# Patient Record
Sex: Male | Born: 1985
Health system: Southern US, Community
[De-identification: ages and names within clinical notes are randomized; demographics above are authoritative.]

## PROBLEM LIST (undated history)

## (undated) DIAGNOSIS — F32A Depression, unspecified: Secondary | ICD-10-CM

## (undated) DIAGNOSIS — G44229 Chronic tension-type headache, not intractable: Secondary | ICD-10-CM

## (undated) DIAGNOSIS — T7840XA Allergy, unspecified, initial encounter: Secondary | ICD-10-CM

## (undated) DIAGNOSIS — K219 Gastro-esophageal reflux disease without esophagitis: Secondary | ICD-10-CM

## (undated) DIAGNOSIS — F329 Major depressive disorder, single episode, unspecified: Secondary | ICD-10-CM

## (undated) DIAGNOSIS — F419 Anxiety disorder, unspecified: Secondary | ICD-10-CM

## (undated) HISTORY — DX: Depression, unspecified: F32.A

## (undated) HISTORY — DX: Allergy, unspecified, initial encounter: T78.40XA

## (undated) HISTORY — PX: VASECTOMY: SHX75

## (undated) HISTORY — DX: Anxiety disorder, unspecified: F41.9

## (undated) HISTORY — DX: Major depressive disorder, single episode, unspecified: F32.9

## (undated) HISTORY — DX: Chronic tension-type headache, not intractable: G44.229

## (undated) HISTORY — DX: Gastro-esophageal reflux disease without esophagitis: K21.9

---
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
## 2015-09-07 ENCOUNTER — Other Ambulatory Visit: Payer: Self-pay | Admitting: Family Medicine

## 2015-09-07 MED ORDER — OSELTAMIVIR PHOSPHATE 75 MG PO CAPS: 75.0000 mg | ORAL_CAPSULE | Freq: Two times a day (BID) | ORAL | Status: DC

## 2016-03-30 DIAGNOSIS — H5213 Myopia, bilateral: Secondary | ICD-10-CM | POA: Diagnosis not present

## 2016-05-29 ENCOUNTER — Other Ambulatory Visit: Payer: Self-pay | Admitting: Family

## 2016-05-29 DIAGNOSIS — R05 Cough: Secondary | ICD-10-CM

## 2016-05-29 DIAGNOSIS — R059 Cough, unspecified: Secondary | ICD-10-CM

## 2016-05-29 MED ORDER — BENZONATATE 200 MG PO CAPS: 200.0000 mg | ORAL_CAPSULE | Freq: Two times a day (BID) | ORAL | 0 refills | Status: DC | PRN

## 2016-05-29 NOTE — Progress Notes (Signed)
Cough 2 weeks waxing and waning. Nasal congestion. No fever, wheezing Lung sounds clear PRN tessalon

## 2016-05-30 ENCOUNTER — Other Ambulatory Visit: Payer: Self-pay | Admitting: Family Medicine

## 2016-05-30 MED ORDER — HYDROCOD POLST-CPM POLST ER 10-8 MG/5ML PO SUER: 5.0000 mL | Freq: Two times a day (BID) | ORAL | 0 refills | Status: DC | PRN

## 2017-01-31 ENCOUNTER — Ambulatory Visit (INDEPENDENT_AMBULATORY_CARE_PROVIDER_SITE_OTHER): Payer: 59 | Admitting: Family Medicine

## 2017-01-31 ENCOUNTER — Encounter: Payer: Self-pay | Admitting: Family Medicine

## 2017-01-31 VITALS — BP 118/78 | HR 96 | Temp 98.8°F | Ht 75.5 in | Wt 201.0 lb

## 2017-01-31 DIAGNOSIS — F419 Anxiety disorder, unspecified: Secondary | ICD-10-CM

## 2017-01-31 DIAGNOSIS — Z79899 Other long term (current) drug therapy: Secondary | ICD-10-CM

## 2017-01-31 DIAGNOSIS — T7840XA Allergy, unspecified, initial encounter: Secondary | ICD-10-CM

## 2017-01-31 DIAGNOSIS — K219 Gastro-esophageal reflux disease without esophagitis: Secondary | ICD-10-CM | POA: Diagnosis not present

## 2017-01-31 DIAGNOSIS — Z0000 Encounter for general adult medical examination without abnormal findings: Secondary | ICD-10-CM | POA: Diagnosis not present

## 2017-01-31 DIAGNOSIS — G44229 Chronic tension-type headache, not intractable: Secondary | ICD-10-CM | POA: Diagnosis not present

## 2017-01-31 DIAGNOSIS — Z23 Encounter for immunization: Secondary | ICD-10-CM

## 2017-01-31 DIAGNOSIS — Z1159 Encounter for screening for other viral diseases: Secondary | ICD-10-CM | POA: Diagnosis not present

## 2017-01-31 DIAGNOSIS — Z114 Encounter for screening for human immunodeficiency virus [HIV]: Secondary | ICD-10-CM

## 2017-01-31 NOTE — Progress Notes (Signed)
Phone: [phone]  Subjective:  Patient presents today to establish care.  Prior patient of pediatrician- years ago. Chief complaint-noted.   See problem oriented charting  The following were reviewed and entered/updated in epic: Past Medical History:  Diagnosis Date  . Allergy    claritin prn  . Anxiety    sometimes gets concerned about medical issues. stress with work.   . Chronic tension headaches    improved with change in pillow- monitoring to see if will still occur  . Depression    College  . GERD (gastroesophageal reflux disease)    prilosec 20mg  since weight gain- wants to be closer to 190 then trial off.    Patient Active Problem List   Diagnosis Date Noted  . Anxiety   . GERD (gastroesophageal reflux disease)   . Chronic tension headaches   . Allergy    Past Surgical History:  Procedure Laterality Date  . WISDOM TOOTH EXTRACTION Bilateral 2002    Family History  Problem Relation Age of Onset  . Healthy Mother   . Cancer Father        GIST  . Lung cancer Maternal Grandmother        smoker  . Diabetes Maternal Grandfather   . Stroke Maternal Grandfather   . Skin cancer Paternal Grandmother   . Arthritis/Rheumatoid Paternal Grandmother   . Skin cancer Paternal Grandfather   . Heart disease Paternal Grandfather        CABG in 63s  . Testicular cancer Paternal Uncle   . Hypertension Paternal Uncle   . Hyperlipidemia Paternal Uncle     Medications- reviewed and updated Current Outpatient Prescriptions  Medication Sig Dispense Refill  . esomeprazole (NEXIUM) 20 MG capsule Take 20 mg by mouth daily at 12 noon.    . fluticasone (FLONASE) 50 MCG/ACT nasal spray Place 1 spray into both nostrils daily as needed for allergies or rhinitis.    Marland Kitchen loratadine (CLARITIN) 10 MG tablet Take 10 mg by mouth daily.     No current facility-administered medications for this visit.     Allergies-reviewed and updated No Known Allergies  Social History   Social  History  . Marital status: Married    Spouse name: N/A  . Number of children: N/A  . Years of education: N/A   Social History Main Topics  . Smoking status: Never Smoker  . Smokeless tobacco: Never Used  . Alcohol use 1.2 - 7.2 oz/week    2 - 12 Standard drinks or equivalent per week  . Drug use: No  . Sexual activity: Yes   Other Topics Concern  . None   Social History Narrative   Married.       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  HENT: Negative for hearing loss and tinnitus.   Eyes: Negative for blurred vision and double vision.  Respiratory: Negative for cough and hemoptysis.   Cardiovascular: Negative for chest pain and palpitations.  Gastrointestinal: Positive for heartburn (if misses ppi dose). Negative for nausea.  Genitourinary: Negative for dysuria and urgency.  Musculoskeletal: Positive for neck pain (improved with pillow change). Negative for back pain.  Skin: Negative for itching and rash.  Neurological: Negative for tingling and headaches.  Endo/Heme/Allergies: Negative for polydipsia. Does not bruise/bleed easily.  Psychiatric/Behavioral: Negative for substance abuse  and suicidal ideas.   Objective: BP 118/78   Pulse 96   Temp 98.8 F (37.1 C) (Oral)   Ht 6' 3.5" (1.918 m)   Wt 201 lb (91.2 kg)   SpO2 98%   BMI 24.79 kg/m  Gen: NAD, resting comfortably HEENT: Mucous membranes are moist. Oropharynx normal. TM normal. Eyes: sclera and lids normal, PERRLA Neck: no thyromegaly, no cervical lymphadenopathy CV: RRR no murmurs rubs or gallops Lungs: CTAB no crackles, wheeze, rhonchi Abdomen: soft/nontender/nondistended/normal bowel sounds. No rebound or guarding.  Ext: no edema Skin: warm, dry Neuro: 5/5 strength in upper and lower extremities, normal gait, normal  reflexes  Assessment/Plan:  31 y.o. male presenting for annual physical.  Health Maintenance counseling: 1. Anticipatory guidance: Patient counseled regarding regular dental exams, eye exams, wearing seatbelts.  2. Risk factor reduction:  Advised patient of need for regular exercise and diet rich and fruits and vegetables to reduce risk of heart attack and stroke.  3. Immunizations/screenings/ancillary studies- Tdap today. Hiv screen opts in. Also had glove break in med school- screen HCV. Flu shot at work.  Immunization History  Administered Date(s) Administered  . Influenza-Unspecified 03/15/2016  4. Prostate cancer screening- no family history, start at age 51 at least. Wants to consider starting at 81.   5. Colon cancer screening - no family history, start at age 18-50 80. Skin cancer screening- advised regular sunscreen use. Denies worrisome, changing, or new skin lesions.  7. Testicular cancer screening self exams monthly 8. STD screening- monogamous  Orders Placed This Encounter  Procedures  . Tdap vaccine greater than or equal to 7yo IM  . CBC  . Comprehensive metabolic panel    [place]  . Hepatitis C antibody  . HIV antibody   Status of acute/chronic concerns  GERD_ has failed zantac trial in past. Has lost 15 lbs but wants to lose another 10 lbs before trialing off  Anxiety shows through elevated BP and HR today. Checks at home and 110/70 BP and 70 or 80 HR.   Patient has had a torn glove in surgery before- will add HCV and HIV screen- denies needle stick.   1 year CPE reasonable  Meds ordered this encounter  Medications  . esomeprazole (NEXIUM) 20 MG capsule    Sig: Take 20 mg by mouth daily at 12 noon.  . loratadine (CLARITIN) 10 MG tablet    Sig: Take 10 mg by mouth daily.  . fluticasone (FLONASE) 50 MCG/ACT nasal spray    Sig: Place 1 spray into both nostrils daily as needed for allergies or rhinitis.    Return precautions advised. Garret Reddish,  MD

## 2017-01-31 NOTE — Patient Instructions (Addendum)
Tdap  Please stop by lab before you go  Love your idea of dropping another 10 lbs

## 2017-02-01 ENCOUNTER — Encounter: Payer: Self-pay | Admitting: Family Medicine

## 2017-02-01 DIAGNOSIS — T7840XA Allergy, unspecified, initial encounter: Secondary | ICD-10-CM | POA: Insufficient documentation

## 2017-02-01 DIAGNOSIS — J309 Allergic rhinitis, unspecified: Secondary | ICD-10-CM | POA: Insufficient documentation

## 2017-02-01 DIAGNOSIS — K219 Gastro-esophageal reflux disease without esophagitis: Secondary | ICD-10-CM | POA: Insufficient documentation

## 2017-02-01 DIAGNOSIS — G44229 Chronic tension-type headache, not intractable: Secondary | ICD-10-CM | POA: Insufficient documentation

## 2017-02-01 DIAGNOSIS — F419 Anxiety disorder, unspecified: Secondary | ICD-10-CM | POA: Insufficient documentation

## 2017-02-01 LAB — COMPREHENSIVE METABOLIC PANEL
ALBUMIN: 4.8 g/dL (ref 3.5–5.2)
ALK PHOS: 40 U/L (ref 39–117)
ALT: 17 U/L (ref 0–53)
AST: 16 U/L (ref 0–37)
BUN: 11 mg/dL (ref 6–23)
CO2: 26 mEq/L (ref 19–32)
CREATININE: 0.99 mg/dL (ref 0.40–1.50)
Calcium: 9.7 mg/dL (ref 8.4–10.5)
Chloride: 101 mEq/L (ref 96–112)
GFR: 92.62 mL/min (ref >60.00)
Glucose, Bld: 114 mg/dL — ABNORMAL HIGH (ref 70–99)
Potassium: 3.4 mEq/L — ABNORMAL LOW (ref 3.5–5.1)
SODIUM: 138 meq/L (ref 135–145)
Total Bilirubin: 0.8 mg/dL (ref 0.2–1.2)
Total Protein: 7 g/dL (ref 6.0–8.3)

## 2017-02-01 LAB — CBC
HEMATOCRIT: 46.7 % (ref 39.0–52.0)
Hemoglobin: 15.9 g/dL (ref 13.0–17.0)
MCHC: 34.1 g/dL (ref 30.0–36.0)
MCV: 93.7 fl (ref 78.0–100.0)
PLATELETS: 271 10*3/uL (ref 150.0–400.0)
RBC: 4.98 Mil/uL (ref 4.22–5.81)
RDW: 12.5 % (ref 11.5–15.5)
WBC: 8.7 10*3/uL (ref 4.0–10.5)

## 2017-02-01 LAB — HIV ANTIBODY (ROUTINE TESTING W REFLEX): HIV: NONREACTIVE

## 2017-02-01 LAB — HEPATITIS C ANTIBODY: HCV AB: NONREACTIVE

## 2017-05-23 DIAGNOSIS — H5213 Myopia, bilateral: Secondary | ICD-10-CM | POA: Diagnosis not present

## 2017-06-13 ENCOUNTER — Telehealth: Payer: 59 | Admitting: Physician Assistant

## 2017-06-13 DIAGNOSIS — B9689 Other specified bacterial agents as the cause of diseases classified elsewhere: Secondary | ICD-10-CM

## 2017-06-13 DIAGNOSIS — J019 Acute sinusitis, unspecified: Secondary | ICD-10-CM

## 2017-06-13 MED ORDER — AMOXICILLIN-POT CLAVULANATE 875-125 MG PO TABS: 1.0000 | ORAL_TABLET | Freq: Two times a day (BID) | ORAL | 0 refills | Status: DC

## 2017-06-13 NOTE — Progress Notes (Signed)

## 2017-09-06 ENCOUNTER — Other Ambulatory Visit: Payer: Self-pay | Admitting: Occupational Medicine

## 2017-09-07 LAB — CBC WITH DIFFERENTIAL/PLATELET
BASOS PCT: 0.7 %
Basophils Absolute: 40 cells/uL (ref 0–200)
EOS ABS: 137 {cells}/uL (ref 15–500)
Eosinophils Relative: 2.4 %
HEMATOCRIT: 45.8 % (ref 38.5–50.0)
Hemoglobin: 15.9 g/dL (ref 13.2–17.1)
LYMPHS ABS: 2160 {cells}/uL (ref 850–3900)
MCH: 30.9 pg (ref 27.0–33.0)
MCHC: 34.7 g/dL (ref 32.0–36.0)
MCV: 89.1 fL (ref 80.0–100.0)
MPV: 10.7 fL (ref 7.5–12.5)
Monocytes Relative: 8.6 %
Neutro Abs: 2873 cells/uL (ref 1500–7800)
Neutrophils Relative %: 50.4 %
PLATELETS: 291 10*3/uL (ref 140–400)
RBC: 5.14 10*6/uL (ref 4.20–5.80)
RDW: 12.3 % (ref 11.0–15.0)
TOTAL LYMPHOCYTE: 37.9 %
WBC mixed population: 490 cells/uL (ref 200–950)
WBC: 5.7 10*3/uL (ref 3.8–10.8)

## 2017-09-07 LAB — COMPLETE METABOLIC PANEL WITH GFR
AG Ratio: 1.9 (calc) (ref 1.0–2.5)
ALBUMIN MSPROF: 4.8 g/dL (ref 3.6–5.1)
ALKALINE PHOSPHATASE (APISO): 44 U/L (ref 40–115)
ALT: 20 U/L (ref 9–46)
AST: 18 U/L (ref 10–40)
BUN: 12 mg/dL (ref 7–25)
CALCIUM: 9.9 mg/dL (ref 8.6–10.3)
CO2: 28 mmol/L (ref 20–32)
CREATININE: 0.91 mg/dL (ref 0.60–1.35)
Chloride: 103 mmol/L (ref 98–110)
GFR, EST NON AFRICAN AMERICAN: 110 mL/min/{1.73_m2} (ref >=60)
GFR, Est African American: 128 mL/min/{1.73_m2} (ref >=60)
GLOBULIN: 2.5 g/dL (ref 1.9–3.7)
GLUCOSE: 92 mg/dL (ref 65–99)
Potassium: 4.4 mmol/L (ref 3.5–5.3)
SODIUM: 139 mmol/L (ref 135–146)
Total Bilirubin: 0.6 mg/dL (ref 0.2–1.2)
Total Protein: 7.3 g/dL (ref 6.1–8.1)

## 2017-09-07 LAB — LIPID PANEL
CHOLESTEROL: 161 mg/dL (ref <200)
HDL: 49 mg/dL (ref >40)
LDL Cholesterol (Calc): 98 mg/dL (calc)
Non-HDL Cholesterol (Calc): 112 mg/dL (calc) (ref <130)
Total CHOL/HDL Ratio: 3.3 (calc) (ref <5.0)
Triglycerides: 47 mg/dL (ref <150)

## 2017-09-07 LAB — PSA: PSA: 0.3 ng/mL (ref <=4.0)

## 2017-12-12 ENCOUNTER — Encounter: Payer: Self-pay | Admitting: Family Medicine

## 2017-12-12 ENCOUNTER — Ambulatory Visit (INDEPENDENT_AMBULATORY_CARE_PROVIDER_SITE_OTHER): Payer: 59 | Admitting: Family Medicine

## 2017-12-12 VITALS — BP 118/64 | HR 107 | Temp 98.6°F | Ht 75.5 in | Wt 201.6 lb

## 2017-12-12 DIAGNOSIS — B029 Zoster without complications: Secondary | ICD-10-CM | POA: Diagnosis not present

## 2017-12-12 MED ORDER — VALACYCLOVIR HCL 1 G PO TABS: 1000.0000 mg | ORAL_TABLET | Freq: Three times a day (TID) | ORAL | 0 refills | Status: DC

## 2017-12-12 NOTE — Patient Instructions (Addendum)
Valtrex for 7 days for likely shingles.   Obviously let me know if new or worsening symptoms  Also let me know if you want to do anything additional for pain control like gabapentin at night

## 2017-12-12 NOTE — Progress Notes (Signed)
Subjective:  Frank Young is a 32 y.o. year old very pleasant male patient who presents for/with See problem oriented charting ROS- fatigue and headaches noted. A few new red spots along T10 when patient looks today. No chest pain, shortness of breath, runny nose.    Past Medical History-  Patient Active Problem List   Diagnosis Date Noted  . Anxiety   . GERD (gastroesophageal reflux disease)   . Chronic tension headaches   . Allergy     Medications- reviewed and updated Current Outpatient Medications  Medication Sig Dispense Refill  . ranitidine (ZANTAC) 150 MG tablet Take 150 mg by mouth 2 (two) times daily.    Marland Kitchen esomeprazole (NEXIUM) 20 MG capsule Take 20 mg by mouth daily at 12 noon.    . fluticasone (FLONASE) 50 MCG/ACT nasal spray Place 1 spray into both nostrils daily as needed for allergies or rhinitis.    Marland Kitchen loratadine (CLARITIN) 10 MG tablet Take 10 mg by mouth daily.     No current facility-administered medications for this visit.     Objective: BP 118/64 (BP Location: Left Arm, Patient Position: Sitting, Cuff Size: Normal)   Pulse (!) 107   Temp 98.6 F (37 C) (Oral)   Ht 6' 3.5" (1.918 m)   Wt 201 lb 9.6 oz (91.4 kg)   SpO2 97%   BMI 24.87 kg/m  Gen: NAD, resting comfortably CV: RRR no murmurs rubs or gallops Lungs: CTAB no crackles, wheeze, rhonchi Abdomen: soft/nontender/nondistended/normal bowel sounds. No rebound or guarding.  Ext: no edema Skin: warm, dry, along T10 there are 3 erythematous papules. No obvious vesicles Neuro: grossly normal, moves all extremities  Assessment/Plan:  Rash S:generalized achiness. Headaches recurring after ibuprofen several days in a row. No fever. Some chills. Started yesterday with pain along t10 distribution on right side only (thought it was from hitting golf balls at first) then touched side and noted skin sensitivity- not just MSK tenderness. hasnt noted rash yet. Woke up with another headache today and pain still  present. Pain 2/10 with 800 ibuprofen. Not waking him up from sleep.   ROS-noted chills. No new medications. Not immunocompromised. No mucus membrane involvement.  A/P: Strongly suspect this is early shingles with papules just appearing today along T10 along with preceding fatigue, headaches (no vesicles yet). Valtrex for 7 days. He wants to use ibuprofen 800mg  for pain control (excellent kidney function). We discussed options like gabapentin just at night or lyrica but he will let me know if he wants to consider those. 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   Also please note he denies any tick bites.   Meds ordered this encounter  Medications  . valACYclovir (VALTREX) 1000 MG tablet    Sig: Take 1 tablet (1,000 mg total) by mouth 3 (three) times daily. For 7 days for shingles.    Dispense:  21 tablet    Refill:  0    Return precautions advised.  Garret Reddish, MD

## 2017-12-14 ENCOUNTER — Other Ambulatory Visit: Payer: Self-pay | Admitting: Family Medicine

## 2017-12-14 MED ORDER — GABAPENTIN 300 MG PO CAPS: 300.0000 mg | ORAL_CAPSULE | Freq: Three times a day (TID) | ORAL | 2 refills | Status: DC | PRN

## 2017-12-14 NOTE — Progress Notes (Unsigned)
Sent in gabapentin for shingles

## 2018-05-07 ENCOUNTER — Encounter: Payer: Self-pay | Admitting: Family Medicine

## 2018-05-22 ENCOUNTER — Ambulatory Visit (INDEPENDENT_AMBULATORY_CARE_PROVIDER_SITE_OTHER): Payer: 59 | Admitting: Family Medicine

## 2018-05-22 ENCOUNTER — Ambulatory Visit (INDEPENDENT_AMBULATORY_CARE_PROVIDER_SITE_OTHER): Payer: 59

## 2018-05-22 ENCOUNTER — Encounter: Payer: Self-pay | Admitting: Family Medicine

## 2018-05-22 VITALS — BP 134/76 | HR 84 | Temp 97.8°F | Ht 75.5 in | Wt 209.4 lb

## 2018-05-22 DIAGNOSIS — N471 Phimosis: Secondary | ICD-10-CM

## 2018-05-22 DIAGNOSIS — R0781 Pleurodynia: Secondary | ICD-10-CM

## 2018-05-22 LAB — COMPREHENSIVE METABOLIC PANEL
ALBUMIN: 4.7 g/dL (ref 3.5–5.2)
ALK PHOS: 45 U/L (ref 39–117)
ALT: 19 U/L (ref 0–53)
AST: 15 U/L (ref 0–37)
BILIRUBIN TOTAL: 0.6 mg/dL (ref 0.2–1.2)
BUN: 13 mg/dL (ref 6–23)
CO2: 28 mEq/L (ref 19–32)
Calcium: 9.6 mg/dL (ref 8.4–10.5)
Chloride: 105 mEq/L (ref 96–112)
Creatinine, Ser: 0.99 mg/dL (ref 0.40–1.50)
GFR: 91.89 mL/min (ref >60.00)
GLUCOSE: 97 mg/dL (ref 70–99)
Potassium: 4.2 mEq/L (ref 3.5–5.1)
SODIUM: 142 meq/L (ref 135–145)
TOTAL PROTEIN: 7.3 g/dL (ref 6.0–8.3)

## 2018-05-22 LAB — CBC WITH DIFFERENTIAL/PLATELET
BASOS ABS: 0 10*3/uL (ref 0.0–0.1)
Basophils Relative: 0.9 % (ref 0.0–3.0)
Eosinophils Absolute: 0.1 10*3/uL (ref 0.0–0.7)
Eosinophils Relative: 2.4 % (ref 0.0–5.0)
HCT: 47.6 % (ref 39.0–52.0)
Hemoglobin: 16.5 g/dL (ref 13.0–17.0)
LYMPHS ABS: 1.9 10*3/uL (ref 0.7–4.0)
Lymphocytes Relative: 34.6 % (ref 12.0–46.0)
MCHC: 34.6 g/dL (ref 30.0–36.0)
MCV: 91.9 fl (ref 78.0–100.0)
MONO ABS: 0.4 10*3/uL (ref 0.1–1.0)
Monocytes Relative: 7.9 % (ref 3.0–12.0)
Neutro Abs: 3 10*3/uL (ref 1.4–7.7)
Neutrophils Relative %: 54.2 % (ref 43.0–77.0)
Platelets: 274 10*3/uL (ref 150.0–400.0)
RBC: 5.19 Mil/uL (ref 4.22–5.81)
RDW: 13.2 % (ref 11.5–15.5)
WBC: 5.5 10*3/uL (ref 4.0–10.5)

## 2018-05-22 NOTE — Patient Instructions (Addendum)
Please stop by lab and x-ray before you go  If this is unrevealing- we could have you See Thedore Mins or Paulla Fore for possible musculoskeletal cause  We will call you within two weeks about your referral to urology. If you do not hear within 3 weeks, give [place] a call.

## 2018-05-22 NOTE — Progress Notes (Signed)
Subjective:  Frank Young is a 32 y.o. year old very pleasant male patient who presents for/with See problem oriented charting ROS-no fever, chills, nausea, vomiting.  No chest pain or shortness of breath or wheeze.  Does not feel ill overall.  Past Medical History-  Patient Active Problem List   Diagnosis Date Noted  . Anxiety   . GERD (gastroesophageal reflux disease)   . Chronic tension headaches   . Allergy     Medications- reviewed and updated Current Outpatient Medications  Medication Sig Dispense Refill  . esomeprazole (NEXIUM) 20 MG capsule Take 20 mg by mouth daily at 12 noon.    . famotidine (PEPCID) 20 MG tablet Take 20 mg by mouth at bedtime.    . fluticasone (FLONASE) 50 MCG/ACT nasal spray Place 1 spray into both nostrils daily as needed for allergies or rhinitis.    Marland Kitchen loratadine (CLARITIN) 10 MG tablet Take 10 mg by mouth daily.     No current facility-administered medications for this visit.     Objective: BP 134/76 (BP Location: Left Arm, Patient Position: Sitting, Cuff Size: Large)   Pulse 84   Temp 97.8 F (36.6 C) (Oral)   Ht 6' 3.5" (1.918 m)   Wt 209 lb 6.4 oz (95 kg)   SpO2 97%   BMI 25.83 kg/m  Gen: NAD, resting comfortably CV: RRR no murmurs rubs or gallops Lungs: CTAB no crackles, wheeze, rhonchi Abdomen: soft/nontender/nondistended/normal bowel sounds.  Ext: no edema Skin: warm, dry, no rash MSK: Patient with very mild pinpoint pain over one spot and anterior ribs and similar finding along the same dermatome posteriorly.    Assessment/Plan:   Rib pain on right side - Plan: Comprehensive metabolic panel, CBC with Differential/Platelet, DG Ribs Unilateral W/Chest Right, DG Ribs Unilateral W/Chest Right S: 3 months of right rib pain- above area of prior shingles. Started on right rib- just beyond right upper quadrant. Occasionally very mild abdominal pain but now has moved up and around and doesn't always occur in the same spot. Now sometimes  hurts into back of ribs- and occasionally goes into scapula. No movement that brings it on. Does note it more while sitting but can happen with laying. Some days is not there- majority of days it is there. May be for about half waking hours. Doesn't limit him. Perhaps 2-3/10 pain - at worst up to 5/10. Doesn't hurt when swinging golf club. Does not feel ill. Stable since starting. No rash over the ribs either. Has had back pain in past but no recent back pain.   Sometimes hurts right on bone and can palpate- sometimes between the ribs. everytime it does hurt he can palpate and reproduce it.   Ibuprofen doesn't help. Eating doesn't make it worse or better. No shortness of breath, cough, chest pain.  A/P: Unclear etiology of pain of right rib pain.  Could be musculoskeletal strain but shifting pattern does not make this is clear-cut.  History of shingles but in lower distribution-doubt postherpetic neuralgia or new onset shingles-no rash noted.  Patient does have some gabapentin left over from time of shingles which he may trial.  We opted to get a CBC and CMP today to evaluate further as well as right-sided rib films.  Equal pulses at the wrist doubt vascular issue.  Phimosis - Plan: Ambulatory referral to Urology  S:- intermittent issues with balanitis- stricture in foreskin- can retract - wants to consider surgical options.  A/P: Patient requests referral to Dr. Leory Plowman  [place] at [facility] as this is closer to his home-referral placed today.  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  Garret Reddish, MD

## 2018-06-04 DIAGNOSIS — L900 Lichen sclerosus et atrophicus: Secondary | ICD-10-CM

## 2018-06-04 HISTORY — DX: Lichen sclerosus et atrophicus: L90.0

## 2018-06-05 ENCOUNTER — Encounter: Payer: Self-pay | Admitting: Family Medicine

## 2018-07-17 ENCOUNTER — Encounter: Payer: Self-pay | Admitting: Family Medicine

## 2018-07-17 DIAGNOSIS — Z1159 Encounter for screening for other viral diseases: Secondary | ICD-10-CM

## 2018-07-18 ENCOUNTER — Other Ambulatory Visit (INDEPENDENT_AMBULATORY_CARE_PROVIDER_SITE_OTHER): Payer: 59

## 2018-07-18 DIAGNOSIS — Z1159 Encounter for screening for other viral diseases: Secondary | ICD-10-CM | POA: Diagnosis not present

## 2018-07-19 LAB — HEPATITIS C ANTIBODY
Hepatitis C Ab: NONREACTIVE
SIGNAL TO CUT-OFF: 0.02 (ref <1.00)

## 2018-08-03 HISTORY — PX: CIRCUMCISION: SUR203

## 2019-07-16 DIAGNOSIS — N471 Phimosis: Secondary | ICD-10-CM | POA: Diagnosis not present

## 2019-07-22 ENCOUNTER — Encounter: Payer: Self-pay | Admitting: Family Medicine

## 2019-08-10 ENCOUNTER — Encounter: Payer: Self-pay | Admitting: Family Medicine

## 2019-08-13 ENCOUNTER — Encounter: Payer: Self-pay | Admitting: Family Medicine

## 2019-08-13 ENCOUNTER — Ambulatory Visit (INDEPENDENT_AMBULATORY_CARE_PROVIDER_SITE_OTHER): Payer: 59 | Admitting: Family Medicine

## 2019-08-13 VITALS — BP 126/75 | Temp 97.8°F | Ht 75.0 in | Wt 198.0 lb

## 2019-08-13 DIAGNOSIS — Z0000 Encounter for general adult medical examination without abnormal findings: Secondary | ICD-10-CM | POA: Diagnosis not present

## 2019-08-13 DIAGNOSIS — K219 Gastro-esophageal reflux disease without esophagitis: Secondary | ICD-10-CM | POA: Diagnosis not present

## 2019-08-13 DIAGNOSIS — Z13220 Encounter for screening for lipoid disorders: Secondary | ICD-10-CM

## 2019-08-13 DIAGNOSIS — Z79899 Other long term (current) drug therapy: Secondary | ICD-10-CM | POA: Diagnosis not present

## 2019-08-13 MED ORDER — ESOMEPRAZOLE MAGNESIUM 20 MG PO CPDR: 20.0000 mg | DELAYED_RELEASE_CAPSULE | Freq: Every day | ORAL | 5 refills | Status: DC

## 2019-08-13 NOTE — Assessment & Plan Note (Signed)
GERD- was doing prilosec or nexium every other day with pepcid in between. After covid vaccine- on nexium OTC daily. Basically 3.5 years on PPI. Decreased alcohol and stopped drinking coffee but still with issues.  -patient young and healthy and reasonable weight yet long term PPI needs with recent worsening- will refer to Dr. Bryan Lemma to consider EGD and possible procedural correction - with long term PPI use opted to check b12

## 2019-08-13 NOTE — Patient Instructions (Signed)
There are no preventive care reminders to display for this patient. Depression screen [facility] 2/9 05/22/2018 01/31/2017  Decreased Interest 0 0  Down, Depressed, Hopeless 0 0  PHQ - 2 Score 0 0

## 2019-08-13 NOTE — Progress Notes (Signed)
Phone [phone] Virtual visit via Video note   Subjective:  Chief complaint: Chief Complaint  Patient presents with  . virtual  . Annual Exam   This visit type was conducted due to national recommendations for restrictions regarding the COVID-19 Pandemic (e.g. social distancing).  This format is felt to be most appropriate for this patient at this time balancing risks to patient and risks to population by having him in for in person visit.  No physical exam was performed (except for noted visual exam or audio findings with Telehealth visits).    Our team/I connected with Leone Haven at  1:20 PM EST by a video enabled telemedicine application (doxy.me or caregility through epic) and verified that I am speaking with the correct person using two identifiers.  Location patient: [place] Location provider: [facility], office Persons participating in the virtual visit:  patient  Our team/I discussed the limitations of evaluation and management by telemedicine and the availability of in person appointments. In light of current covid-19 pandemic, patient also understands that we are trying to protect them by minimizing in office contact if at all possible.  The patient expressed consent for telemedicine visit and agreed to proceed. Patient understands insurance will be billed.   See problem oriented charting- ROS- full  review of systems was completed and negative  except for: costochondritis recently now resolved, blurry vision  The following were reviewed and entered/updated in epic: Past Medical History:  Diagnosis Date  . Allergy    claritin prn  . Anxiety    sometimes gets concerned about medical issues. stress with work.   . Chronic tension headaches    improved with change in pillow- monitoring to see if will still occur  . Depression    College  . GERD (gastroesophageal reflux disease)    prilosec 20mg  since weight gain- wants to be closer to 190 then trial off.     Patient Active Problem List   Diagnosis Date Noted  . Anxiety   . GERD (gastroesophageal reflux disease)   . Chronic tension headaches   . Allergy    Past Surgical History:  Procedure Laterality Date  . WISDOM TOOTH EXTRACTION Bilateral 2002    Family History  Problem Relation Age of Onset  . Healthy Mother   . Cancer Father        GIST  . Lung cancer Maternal Grandmother        smoker  . Diabetes Maternal Grandfather   . Stroke Maternal Grandfather   . Skin cancer Paternal Grandmother   . Arthritis/Rheumatoid Paternal Grandmother   . Skin cancer Paternal Grandfather   . Heart disease Paternal Grandfather        CABG in 88s  . Testicular cancer Paternal Uncle   . Hypertension Paternal Uncle   . Hyperlipidemia Paternal Uncle     Medications- reviewed and updated Current Outpatient Medications  Medication Sig Dispense Refill  . esomeprazole (NEXIUM) 20 MG capsule Take 1 capsule (20 mg total) by mouth daily. 30 capsule 5  . famotidine (PEPCID) 20 MG tablet Take 20 mg by mouth at bedtime.    . fluticasone (FLONASE) 50 MCG/ACT nasal spray Place 1 spray into both nostrils daily as needed for allergies or rhinitis.     No current facility-administered medications for this visit.    Allergies-reviewed and updated No Known Allergies  Social History   Social History Narrative   Married. Daugher born late 2020.       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   Skin: No rash, lesions or ulcers Musculoskeletal: No digital cyanosis.  Normal gait. Neurological: Normal speech, moves upper extremities  without difficulty Psych: Intact judgment and insight.  Alert and oriented x3 with friendly affect.    Assessment and Plan:  34 y.o. male presenting for annual physical.  Health Maintenance counseling: 1. Anticipatory guidance: Patient counseled regarding regular dental exams yes q6 months, eye exams yes,  avoiding smoking and second hand smoke, limiting alcohol to 2-3 beverages per week.   2. Risk factor reduction:  Advised patient of need for regular exercise and diet rich and fruits and vegetables to reduce risk of heart attack and stroke. Exercise- lifting weights 2-3 times a week and golf once a week. Diet-healthier diet and has lost some weight. Calorie counting helpful for him.  Wt Readings from Last 3 Encounters:  08/13/19 198 lb (89.8 kg)  05/22/18 209 lb 6.4 oz (95 kg)  12/12/17 201 lb 9.6 oz (91.4 kg)  3. Immunizations/screenings/ancillary studies- had flu shot with work this year Immunization History  Administered Date(s) Administered  . Influenza-Unspecified 03/15/2016, 03/14/2018  . PFIZER SARS-COV-2 Vaccination 06/22/2019, 07/10/2019  . Tdap 01/31/2017  4. Prostate cancer screening-  Prior PSA from doctors day- no family history prostate cancer  Lab Results  Component Value Date   PSA 0.3 09/06/2017   5. Colon cancer screening - grandfather age 69 but no early history- would plan on starting age 87 6. Skin cancer screening/prevention- does not see dermatology. advised regular sunscreen use. Denies worrisome, changing, or new skin lesions- one spot on eyelid that he may have dermatologist look at- in future- his optometrist was not concerned.  7. Testicular cancer screening- advised monthly self exams yea.. 8. STD screening- patient opts out- as monogomous.  no issues with donating blood.  63. Never smoker-   Status of chronic or acute concerns   Daughter first week in daycare and slept 11 hours after that- has been sleeping 8-9 hours since 2 months old- now 6 months old.     Stress is way better with new schedule at 0.8.  Early next morning and do stuff and status like say I got finished everything at night  Prior rib pain resolved. Did have some costochondritis after covid vaccine  GERD- was doing prilosec or nexium every other day with pepcid in between. After covid vaccine- on nexium OTC daily. Basically 3.5 years on PPI. Decreased alcohol and stopped drinking coffee but still with issues.  -patient young and healthy and reasonable weight yet long term PPI needs with recent worsening- will refer to Dr. Bryan Lemma to consider EGD and possible procedural correction - with long term PPI use opted to check b12  No recurrence of prior shingles episode in 2019 Recommended follow up:  1-2 year physical  Lab/Order associations: likely fasting in future   ICD-10-CM   1. Preventative health care  Z00.00 CBC with Differential/Platelet    Comprehensive metabolic panel    Lipid panel    Vitamin B12    Ambulatory referral to  Gastroenterology  2. Gastroesophageal reflux disease, unspecified whether esophagitis present  K21.9 Ambulatory referral to Gastroenterology  3. High risk medication use  Z79.899 Vitamin B12  4. Screening for hyperlipidemia  Z13.220 CBC with Differential/Platelet    Comprehensive metabolic panel    Lipid panel   Meds ordered this encounter  Medications  . esomeprazole (NEXIUM) 20 MG capsule    Sig: Take 1 capsule (20 mg total) by mouth daily.    Dispense:  30 capsule    Refill:  5   Return precautions advised.  Garret Reddish, MD

## 2019-08-14 ENCOUNTER — Encounter: Payer: Self-pay | Admitting: Family Medicine

## 2019-09-01 ENCOUNTER — Encounter: Payer: Self-pay | Admitting: Gastroenterology

## 2019-09-08 ENCOUNTER — Other Ambulatory Visit: Payer: Self-pay

## 2019-09-08 ENCOUNTER — Encounter: Payer: Self-pay | Admitting: Dermatology

## 2019-09-08 ENCOUNTER — Ambulatory Visit (INDEPENDENT_AMBULATORY_CARE_PROVIDER_SITE_OTHER): Payer: 59 | Admitting: Dermatology

## 2019-09-08 DIAGNOSIS — D485 Neoplasm of uncertain behavior of skin: Secondary | ICD-10-CM | POA: Diagnosis not present

## 2019-09-08 DIAGNOSIS — Z872 Personal history of diseases of the skin and subcutaneous tissue: Secondary | ICD-10-CM

## 2019-09-08 DIAGNOSIS — D229 Melanocytic nevi, unspecified: Secondary | ICD-10-CM

## 2019-09-08 DIAGNOSIS — L814 Other melanin hyperpigmentation: Secondary | ICD-10-CM

## 2019-09-08 DIAGNOSIS — B078 Other viral warts: Secondary | ICD-10-CM

## 2019-09-08 DIAGNOSIS — L578 Other skin changes due to chronic exposure to nonionizing radiation: Secondary | ICD-10-CM

## 2019-09-08 DIAGNOSIS — L739 Follicular disorder, unspecified: Secondary | ICD-10-CM | POA: Diagnosis not present

## 2019-09-08 DIAGNOSIS — D2371 Other benign neoplasm of skin of right lower limb, including hip: Secondary | ICD-10-CM | POA: Diagnosis not present

## 2019-09-08 DIAGNOSIS — Z1283 Encounter for screening for malignant neoplasm of skin: Secondary | ICD-10-CM | POA: Diagnosis not present

## 2019-09-08 DIAGNOSIS — D239 Other benign neoplasm of skin, unspecified: Secondary | ICD-10-CM

## 2019-09-08 DIAGNOSIS — D1800 Hemangioma unspecified site: Secondary | ICD-10-CM | POA: Diagnosis not present

## 2019-09-08 NOTE — Progress Notes (Signed)
   New Patient Visit  Subjective  Frank Young is a 34 y.o. male who presents for the following: Patient is here for skin cancer screening and mole check.   Patient noticed a spot at right eye that is new, present for 3-4 months. Non-symptomatic. No personal history of skin cancer but grandmother had radiation and excision for a recurring BCC on scalp.  Also has a spot on left hand for about 6 months with no change. Not symptomatic. Pt not aware of any previous trauma or bug bite.   The following portions of the chart were reviewed this encounter and updated as appropriate: medications, allergies, past medical history, social history  Review of Systems:  No other skin or systemic complaints except as noted in HPI or Assessment and Plan.   Objective  Well appearing patient in no apparent distress; mood and affect are within normal limits.  A full examination was performed including scalp, head, eyes, ears, nose, lips, neck, chest, axillae, abdomen, back, buttocks, bilateral upper extremities, bilateral lower extremities, hands, feet, fingers, toes, fingernails, and toenails. All findings within normal limits unless otherwise noted below.  Objective  Right dorsal foot: Firm pink/brown papulenodule with dimple sign.    Objective  Back: Follicular-based erythematous papules and pustules.   Objective  L forearm: 0.5cm slightly erythematous skin colored papule  Objective  Left plantar foot x 2: Verrucous papules -- Discussed viral etiology and contagion.   Assessment & Plan    Dermatofibroma Right dorsal foot  Benign, observe.    Folliculitis Back  Benign, observe.    Neoplasm of uncertain behavior of skin L forearm  Favor Dermatofibroma vs Cyst vs Nevus  Benign-appearing under dermoscopy.  Observation.  Call clinic for new or changing moles.  Recommend daily use of broad spectrum spf 30+ sunscreen to sun-exposed areas.      Other viral warts Left plantar  foot x 2  Start Wart Peel QHS and cover. Instructions given to patient.   Discussed in-office treatment with Canthacur, LN2, Candida.    Skin cancer screening performed today.  Lentigines - Scattered tan macules - Discussed due to sun exposure - Benign, observe - Call for any changes  Hemangiomas - Red papules - Discussed benign nature - Observe - Call for any changes  Melanocytic Nevi - Tan-brown and/or pink-flesh-colored symmetric macules and papules - Benign appearing on exam today - Observation - Call clinic for new or changing moles - Recommend daily use of broad spectrum spf 30+ sunscreen to sun-exposed areas.   Actinic Damage - Recommend daily broad spectrum sunscreen SPF 30+ to sun-exposed areas, reapply every 2 hours as needed.  - Call for new or changing lesions.  History of Lichen Sclerosus - Follows with urology - Reviewed increased risk of cancer if inflammation is not well controlled. Continue follow-up with urology.  Return if symptoms worsen or fail to improve.   Graciella Belton, RMA, am acting as scribe for Forest Gleason, MD .  Documentation: I have reviewed the above documentation for accuracy and completeness, and I agree with the above.  Forest Gleason, MD

## 2019-09-08 NOTE — Patient Instructions (Addendum)
Recommend daily broad spectrum sunscreen SPF 30+ to sun-exposed areas, reapply every 2 hours as needed. Call for new or changing lesions.  Recommend HelioCare Regular or Ultra for added sun protection.

## 2019-09-09 NOTE — Addendum Note (Signed)
Addended by: Alfonso Patten on: 09/09/2019 02:48 PM   Modules accepted: Level of Service

## 2019-10-01 ENCOUNTER — Encounter: Payer: Self-pay | Admitting: Family Medicine

## 2019-10-01 ENCOUNTER — Other Ambulatory Visit: Payer: Self-pay

## 2019-10-01 ENCOUNTER — Other Ambulatory Visit (INDEPENDENT_AMBULATORY_CARE_PROVIDER_SITE_OTHER): Payer: 59

## 2019-10-01 DIAGNOSIS — K219 Gastro-esophageal reflux disease without esophagitis: Secondary | ICD-10-CM

## 2019-10-01 DIAGNOSIS — Z0000 Encounter for general adult medical examination without abnormal findings: Secondary | ICD-10-CM | POA: Diagnosis not present

## 2019-10-01 DIAGNOSIS — Z13220 Encounter for screening for lipoid disorders: Secondary | ICD-10-CM | POA: Diagnosis not present

## 2019-10-01 DIAGNOSIS — Z79899 Other long term (current) drug therapy: Secondary | ICD-10-CM | POA: Diagnosis not present

## 2019-10-01 LAB — LIPID PANEL
Cholesterol: 143 mg/dL (ref 0–200)
HDL: 40.7 mg/dL (ref >39.00)
LDL Cholesterol: 90 mg/dL (ref 0–99)
NonHDL: 102.36
Total CHOL/HDL Ratio: 4
Triglycerides: 64 mg/dL (ref 0.0–149.0)
VLDL: 12.8 mg/dL (ref 0.0–40.0)

## 2019-10-01 LAB — CBC WITH DIFFERENTIAL/PLATELET
Basophils Absolute: 0 10*3/uL (ref 0.0–0.1)
Basophils Relative: 0.8 % (ref 0.0–3.0)
Eosinophils Absolute: 0.1 10*3/uL (ref 0.0–0.7)
Eosinophils Relative: 2.2 % (ref 0.0–5.0)
HCT: 45.5 % (ref 39.0–52.0)
Hemoglobin: 15.2 g/dL (ref 13.0–17.0)
Lymphocytes Relative: 33.9 % (ref 12.0–46.0)
Lymphs Abs: 2 10*3/uL (ref 0.7–4.0)
MCHC: 33.4 g/dL (ref 30.0–36.0)
MCV: 93.4 fl (ref 78.0–100.0)
Monocytes Absolute: 0.5 10*3/uL (ref 0.1–1.0)
Monocytes Relative: 9.5 % (ref 3.0–12.0)
Neutro Abs: 3.1 10*3/uL (ref 1.4–7.7)
Neutrophils Relative %: 53.6 % (ref 43.0–77.0)
Platelets: 272 10*3/uL (ref 150.0–400.0)
RBC: 4.87 Mil/uL (ref 4.22–5.81)
RDW: 13 % (ref 11.5–15.5)
WBC: 5.8 10*3/uL (ref 4.0–10.5)

## 2019-10-01 LAB — COMPREHENSIVE METABOLIC PANEL
ALT: 25 U/L (ref 0–53)
AST: 21 U/L (ref 0–37)
Albumin: 4.6 g/dL (ref 3.5–5.2)
Alkaline Phosphatase: 57 U/L (ref 39–117)
BUN: 15 mg/dL (ref 6–23)
CO2: 31 mEq/L (ref 19–32)
Calcium: 9 mg/dL (ref 8.4–10.5)
Chloride: 104 mEq/L (ref 96–112)
Creatinine, Ser: 0.95 mg/dL (ref 0.40–1.50)
GFR: 89.95 mL/min (ref >60.00)
Glucose, Bld: 84 mg/dL (ref 70–99)
Potassium: 4 mEq/L (ref 3.5–5.1)
Sodium: 139 mEq/L (ref 135–145)
Total Bilirubin: 0.6 mg/dL (ref 0.2–1.2)
Total Protein: 7.2 g/dL (ref 6.0–8.3)

## 2019-10-01 LAB — VITAMIN B12: Vitamin B-12: 286 pg/mL (ref 211–911)

## 2019-10-01 NOTE — Telephone Encounter (Signed)
Please advise 

## 2019-10-03 LAB — HELICOBACTER PYLORI ABS-IGG+IGA, BLD
H. pylori, IgA Abs: <9 units (ref 0.0–8.9)
H. pylori, IgG AbS: 0.18 Index Value (ref 0.00–0.79)

## 2019-10-22 ENCOUNTER — Ambulatory Visit: Payer: 59 | Admitting: Gastroenterology

## 2019-11-19 ENCOUNTER — Ambulatory Visit (INDEPENDENT_AMBULATORY_CARE_PROVIDER_SITE_OTHER): Payer: 59

## 2019-11-19 ENCOUNTER — Encounter: Payer: Self-pay | Admitting: Family Medicine

## 2019-11-19 ENCOUNTER — Ambulatory Visit: Payer: 59 | Admitting: Family Medicine

## 2019-11-19 ENCOUNTER — Other Ambulatory Visit: Payer: Self-pay

## 2019-11-19 VITALS — BP 128/80 | HR 94 | Temp 98.6°F | Ht 75.0 in | Wt 199.8 lb

## 2019-11-19 DIAGNOSIS — M549 Dorsalgia, unspecified: Secondary | ICD-10-CM | POA: Diagnosis not present

## 2019-11-19 DIAGNOSIS — G8929 Other chronic pain: Secondary | ICD-10-CM | POA: Diagnosis not present

## 2019-11-19 DIAGNOSIS — M545 Low back pain: Secondary | ICD-10-CM

## 2019-11-19 DIAGNOSIS — K219 Gastro-esophageal reflux disease without esophagitis: Secondary | ICD-10-CM

## 2019-11-19 DIAGNOSIS — M47816 Spondylosis without myelopathy or radiculopathy, lumbar region: Secondary | ICD-10-CM | POA: Diagnosis not present

## 2019-11-19 DIAGNOSIS — M47814 Spondylosis without myelopathy or radiculopathy, thoracic region: Secondary | ICD-10-CM | POA: Diagnosis not present

## 2019-11-19 NOTE — Patient Instructions (Addendum)
Get x-rays at your office and I will reach out with results  New or worsening symptoms let us know

## 2019-11-19 NOTE — Progress Notes (Signed)
Phone [phone] In person visit   Subjective:   Frank Young is a 34 y.o. year old very pleasant male patient who presents for/with See problem oriented charting Chief Complaint  Patient presents with  . Follow-up  . Back Pain   This visit occurred during the SARS-CoV-2 public health emergency.  Safety protocols were in place, including screening questions prior to the visit, additional usage of staff PPE, and extensive cleaning of exam room while observing appropriate contact time as indicated for disinfecting solutions.   Past Medical History-  Patient Active Problem List   Diagnosis Date Noted  . Anxiety   . GERD (gastroesophageal reflux disease)   . Chronic tension headaches   . Allergy     Medications- reviewed and updated Current Outpatient Medications  Medication Sig Dispense Refill  . clobetasol ointment (TEMOVATE) 0.05 % Apply topically.    Marland Kitchen esomeprazole (NEXIUM) 20 MG capsule Take 1 capsule (20 mg total) by mouth daily. 30 capsule 5  . famotidine (PEPCID) 20 MG tablet Take 20 mg by mouth at bedtime.    . fluticasone (FLONASE) 50 MCG/ACT nasal spray Place 1 spray into both nostrils daily as needed for allergies or rhinitis.     No current facility-administered medications for this visit.     Objective:  BP 128/80   Pulse 94   Temp 98.6 F (37 C)   Ht 6\' 3"  (1.905 m)   Wt 199 lb 12.8 oz (90.6 kg)   SpO2 97%   BMI 24.97 kg/m  Gen: NAD, resting comfortably MSK: Midline back pain either at the bottom of thoracic spine or top of lumbar spine-I lean toward thoracic.  Appears to have good range of motion lumbar spine.  No significant worsening of pain reported with movement    Assessment and Plan  # Back Pain S: pt c/o back pain that's in the top portion of his midline lumbar spine versus lower thoracic that started approximately 2 months ago. He has taken ibuprofen and helps someand he rates his pain a 2/10.  Doesn't hurt worse golf.Originally could be  from golf or working out. No specific injury or fall.   Was also having some diffuse thoracic back pain when he scheduled the appointment that has resolved  ROS-No saddle anesthesia, bladder incontinence, fecal incontinence, weakness in extremity, numbness or tingling in extremity. History negative for trauma, history of cancer, fever, chills, unintentional weight loss, recent bacterial infection, recent IV drug use, HIV, pain worse at night or while supine.  A/P: Given 2 months of pain including midline back pain-we need to go ahead and obtain films-due to location in either lower thoracic or upper lumbar we opted to order films for both to make sure he picked up the area.  # GERD S: Medication: Compliant with Nexium 20 mg as well as Pepcid at bedtime but still occasionally getting reflux symptoms  He has improved lifestyle-eating a healthier diet and avoiding triggers but still having issues A/P: GI appointment was rescheduled after patient developed COVID-19-he has follow-up next week.  For now we will continue on current medications -Consideration for EGD. -Even if EGD normal patient potentially interested in TIF procedure to avoid long-term PPI use  Recommended follow up: Return for as needed for new, worsening, persistent symptoms. otherwise yearly for physical . Future Appointments  Date Time Provider [place]  11/27/2019 10:20 AM Cirigliano, Dominic Pea, DO [facility]    Lab/Order associations:   ICD-10-CM   1. Chronic midline low back  pain without sciatica  M54.5    G89.29   2. Back pain, unspecified back location, unspecified back pain laterality, unspecified chronicity  M54.9 DG Thoracic Spine W/Swimmers    DG Lumbar Spine Complete  3. Gastroesophageal reflux disease without esophagitis  K21.9     Return precautions advised.  Garret Reddish, MD

## 2019-11-27 ENCOUNTER — Ambulatory Visit (INDEPENDENT_AMBULATORY_CARE_PROVIDER_SITE_OTHER): Payer: 59 | Admitting: Gastroenterology

## 2019-11-27 ENCOUNTER — Encounter: Payer: Self-pay | Admitting: Gastroenterology

## 2019-11-27 VITALS — BP 120/78 | HR 80 | Temp 98.1°F | Ht 75.0 in | Wt 202.2 lb

## 2019-11-27 DIAGNOSIS — K219 Gastro-esophageal reflux disease without esophagitis: Secondary | ICD-10-CM | POA: Diagnosis not present

## 2019-11-27 NOTE — Patient Instructions (Signed)
You have been scheduled for an endoscopy. Please follow written instructions given to you at your visit today. If you use inhalers (even only as needed), please bring them with you on the day of your procedure. Your physician has requested that you go to www.startemmi.com and enter the access code given to you at your visit today. This web site gives a general overview about your procedure. However, you should still follow specific instructions given to you by our office regarding your preparation for the procedure.  It was a pleasure to see you today!  Vito Cirigliano, D.O.

## 2019-11-27 NOTE — Progress Notes (Signed)
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  Has history of reflux symptoms since 2017.  Index symptoms of heartburn, regurgitation, dyspepsia. More recently with globus sensation. Trialed HOB elevation/wedge pillow, without improvement. Occasional nocturnal cough and raspy voice.  Has been taking OTC PPI for the last 3+ years.  Was most recently seen by his PCM on 11/19/2019.  Given ongoing breakthrough reflux despite Nexium 20 mg/day and Pepcid 20 mg at bedtime, was referred to GI for further evaluation and consideration of antireflux surgery.  No previous EGD. No lower GI symptoms.  Had Covid-19 in March, after vaccine. Interestingly, he states reflux symptoms have actually gotten worse since Covid vaccine.  Father with GIST ~2011. PGF with CRC in his 41's.    -10/01/2019: Normal CBC, CMP.  H. pylori negative.  B12 low normal (286)-started low-dose OTC B12 supplement   Past Medical History:  Diagnosis Date  . Allergy    claritin prn  . Anxiety    sometimes gets concerned about medical issues. stress with work.   . Chronic tension headaches    improved with change in pillow- monitoring to see if will still occur  . Depression    [employer]  . GERD (gastroesophageal reflux disease)    prilosec 20mg  since weight gain- wants to be closer to 190 then trial off.      Past Surgical History:  Procedure Laterality Date  . CIRCUMCISION  08/2018  . WISDOM TOOTH EXTRACTION Bilateral 2002   Family History  Problem Relation Age of Onset  . Healthy Mother   . Cancer Father        GIST  . Lung cancer Maternal Grandmother        smoker  . Diabetes Maternal Grandfather   . Stroke Maternal Grandfather   . Skin cancer Paternal Grandmother   . Arthritis/Rheumatoid Paternal Grandmother   . Skin cancer Paternal  Grandfather   . Heart disease Paternal Grandfather        CABG in 33s  . Colon cancer Paternal Grandfather   . Testicular cancer Paternal Uncle   . Hypertension Paternal Uncle   . Hyperlipidemia Paternal Uncle    Social History   Tobacco Use  . Smoking status: Never Smoker  . Smokeless tobacco: Never Used  Vaping Use  . Vaping Use: Never used  Substance Use Topics  . Alcohol use: Yes    Alcohol/week: 1.0 - 2.0 standard drink    Types: 1 - 2 Cans of beer per week    Comment: weekly  . Drug use: No   Current Outpatient Medications  Medication Sig Dispense Refill  . B Complex Vitamins (B-COMPLEX/B-12 PO) Take 1,000 mcg by mouth daily.    . clobetasol ointment (TEMOVATE) 0.05 % Apply topically 2 (two) times daily.     Marland Kitchen esomeprazole (NEXIUM) 20 MG capsule Take 1 capsule (20 mg total) by mouth daily. 30 capsule 5  . famotidine (PEPCID) 20 MG tablet Take 20 mg by mouth at bedtime.    . fexofenadine (ALLEGRA) 180 MG tablet Take 180 mg by mouth daily.    . fluticasone (FLONASE) 50 MCG/ACT nasal spray Place 1 spray into both nostrils daily as needed for allergies or rhinitis.    . Multiple Vitamin (  MULTIVITAMIN) tablet Take 1 tablet by mouth daily.     No current facility-administered medications for this visit.   No Known Allergies   Review of Systems: All systems reviewed and negative except where noted in HPI.     Physical Exam:    Wt Readings from Last 3 Encounters:  11/27/19 202 lb 4 oz (91.7 kg)  11/19/19 199 lb 12.8 oz (90.6 kg)  08/13/19 198 lb (89.8 kg)    BP 120/78   Pulse 80   Temp 98.1 F (36.7 C)   Ht 6\' 3"  (1.905 m)   Wt 202 lb 4 oz (91.7 kg)   BMI 25.28 kg/m  Constitutional:  Pleasant, in no acute distress. Psychiatric: Normal mood and affect. Behavior is normal. EENT: Pupils normal.  Conjunctivae are normal. No scleral icterus. Neck supple. No cervical LAD. Cardiovascular: Normal rate, regular rhythm. No edema Pulmonary/chest: Effort normal and  breath sounds normal. No wheezing, rales or rhonchi. Abdominal: Soft, nondistended, nontender. Bowel sounds active throughout. There are no masses palpable. No hepatomegaly. Neurological: Alert and oriented to person place and time. Skin: Skin is warm and dry. No rashes noted.   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 He is interested in antireflux surgical options as a means to better control his GERD along with stopping or significantly reduce the need for acid suppression therapy.  -Schedule EGD to evaluate for erosive esophagitis, LES laxity, hiatal hernia. -Will plan for possible Bravo at that same time in [place] -Stopping PPI and H2 RA 5 days prior to procedure -In the meantime, continue current therapy along with antireflux lifestyle/dietary modifications  The indications, risks, and benefits of EGD with possible Bravo were explained to the patient in detail. Risks include but are not limited to bleeding, perforation, adverse reaction to medications, and cardiopulmonary compromise. Sequelae include but are not limited to the possibility of surgery, hositalization, and mortality. The patient verbalized understanding and wished to proceed. All questions answered, referred to scheduler. Further recommendations pending results of the exam.     Lavena Bullion, DO, FACG  11/27/2019, 10:38 AM   Yong Channel Brayton Mars, MD

## 2019-12-09 ENCOUNTER — Ambulatory Visit
Admission: EM | Admit: 2019-12-09 | Discharge: 2019-12-09 | Disposition: A | Discharge status: Home / Self Care | Payer: 59 | Attending: Emergency Medicine | Admitting: Emergency Medicine

## 2019-12-09 ENCOUNTER — Other Ambulatory Visit: Payer: Self-pay

## 2019-12-09 DIAGNOSIS — H6692 Otitis media, unspecified, left ear: Secondary | ICD-10-CM

## 2019-12-09 DIAGNOSIS — Z7689 Persons encountering health services in other specified circumstances: Secondary | ICD-10-CM | POA: Diagnosis not present

## 2019-12-09 DIAGNOSIS — R059 Cough, unspecified: Secondary | ICD-10-CM

## 2019-12-09 DIAGNOSIS — R05 Cough: Secondary | ICD-10-CM

## 2019-12-09 MED ORDER — AMOXICILLIN 875 MG PO TABS: 875.0000 mg | ORAL_TABLET | Freq: Two times a day (BID) | ORAL | 0 refills | Status: AC

## 2019-12-09 NOTE — ED Provider Notes (Signed)
Frank Young    CSN: [id number] Arrival date & time: 12/09/19  1157      History   Chief Complaint Chief Complaint  Patient presents with   Cough   Otalgia    HPI Frank Young is a 34 y.o. male.   Patient presents with 1 day history of nonproductive cough and acute left ear pain.  He states the pain woke him up last night.  Pain ranges 2-7/10.  He denies fever, chills, sore throat, shortness of breath, vomiting, diarrhea, rash, or other symptoms.  Treatment attempted at home with OTC pain reliever.  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  The history is provided by the patient.    Past Medical History:  Diagnosis Date   Allergy    claritin prn   Anxiety    sometimes gets concerned about medical issues. stress with work.    Chronic tension headaches    improved with change in pillow- monitoring to see if will still occur   Depression    [employer]   GERD (gastroesophageal reflux disease)    prilosec 20mg  since weight gain- wants to be closer to 190 then trial off.     Patient Active Problem List   Diagnosis Date Noted   Anxiety    GERD (gastroesophageal reflux disease)    Chronic tension headaches    Allergy     Past Surgical History:  Procedure Laterality Date   CIRCUMCISION  08/2018   WISDOM TOOTH EXTRACTION Bilateral 2002       Home Medications    Prior to Admission medications   Medication Sig Start Date End Date Taking? Authorizing Provider  amoxicillin (AMOXIL) 875 MG tablet Take 1 tablet (875 mg total) by mouth 2 (two) times daily for 10 days. 12/09/19 12/19/19  Sharion Balloon, NP  B Complex Vitamins (B-COMPLEX/B-12 PO) Take 1,000 mcg by mouth daily.    [provider]  clobetasol ointment (TEMOVATE) 0.05 % Apply topically 2 (two) times daily.  03/27/19 03/26/20  [provider]  esomeprazole (NEXIUM) 20 MG capsule Take 1 capsule (20 mg total) by mouth daily. 08/13/19    Marin Olp, MD  famotidine (PEPCID) 20 MG tablet Take 20 mg by mouth at bedtime.    [provider]  fexofenadine (ALLEGRA) 180 MG tablet Take 180 mg by mouth daily.    [provider]  fluticasone (FLONASE) 50 MCG/ACT nasal spray Place 1 spray into both nostrils daily as needed for allergies or rhinitis.    [provider]  Multiple Vitamin (MULTIVITAMIN) tablet Take 1 tablet by mouth daily.    [provider]    Family History Family History  Problem Relation Age of Onset   Healthy Mother    Cancer Father        GIST   Lung cancer Maternal Grandmother        smoker   Diabetes Maternal Grandfather    Stroke Maternal Grandfather    Skin cancer Paternal Grandmother    Arthritis/Rheumatoid Paternal Grandmother    Skin cancer Paternal Grandfather    Heart disease Paternal Grandfather        CABG in 94s   Colon cancer Paternal Grandfather    Testicular cancer Paternal Uncle    Hypertension Paternal Uncle    Hyperlipidemia Paternal Uncle     Social History Social History   Tobacco Use   Smoking status: Never Smoker   Smokeless tobacco: Never Used  Vaping Use   Vaping Use: Never used  Substance Use Topics   Alcohol use: Yes    Alcohol/week: 1.0 - 2.0 standard drink    Types: 1 - 2 Cans of beer per week    Comment: weekly   Drug use: No     Allergies   Patient has no known allergies.   Review of Systems Review of Systems  Constitutional: Negative for chills and fever.  HENT: Positive for ear pain. Negative for sore throat.   Eyes: Negative for pain and visual disturbance.  Respiratory: Positive for cough. Negative for shortness of breath.   Cardiovascular: Negative for chest pain and palpitations.  Gastrointestinal: Negative for abdominal pain, diarrhea, nausea and vomiting.  Genitourinary: Negative for dysuria and hematuria.  Musculoskeletal: Negative for arthralgias and back pain.  Skin: Negative for  color change and rash.  Neurological: Negative for seizures and syncope.  All other systems reviewed and are negative.    Physical Exam Triage Vital Signs ED Triage Vitals  Enc Vitals Group     BP      Pulse      Resp      Temp      Temp src      SpO2      Weight      Height      Head Circumference      Peak Flow      Pain Score      Pain Loc      Pain Edu?      Excl. in [place]?    No data found.  Updated Vital Signs BP 135/84    Pulse 84    Temp 98.9 F (37.2 C)    Resp 16    SpO2 98%   Visual Acuity Right Eye Distance:   Left Eye Distance:   Bilateral Distance:    Right Eye Near:   Left Eye Near:    Bilateral Near:     Physical Exam Vitals and nursing note reviewed.  Constitutional:      General: He is not in acute distress.    Appearance: He is well-developed.  HENT:     Head: Normocephalic and atraumatic.     Right Ear: Ear canal normal. A middle ear effusion is present.     Left Ear: Ear canal normal. Tympanic membrane is erythematous.     Nose: Nose normal.     Mouth/Throat:     Mouth: Mucous membranes are moist.     Pharynx: Oropharynx is clear.  Eyes:     Conjunctiva/sclera: Conjunctivae normal.  Cardiovascular:     Rate and Rhythm: Normal rate and regular rhythm.     Heart sounds: No murmur heard.   Pulmonary:     Effort: Pulmonary effort is normal. No respiratory distress.     Breath sounds: Normal breath sounds.  Abdominal:     Palpations: Abdomen is soft.     Tenderness: There is no abdominal tenderness. There is no guarding or rebound.  Musculoskeletal:     Cervical back: Neck supple.  Skin:    General: Skin is warm and dry.     Findings: No rash.  Neurological:     General: No focal deficit present.     Mental Status: He is alert and oriented to person, place, and time.     Gait: Gait normal.  Psychiatric:        Mood and Affect: Mood normal.        Behavior:  Behavior normal.      UC Treatments / Results  Labs (all labs  ordered are listed, but only abnormal results are displayed) Labs Reviewed  NOVEL CORONAVIRUS, NAA    EKG   Radiology No results found.  Procedures Procedures (including critical care time)  Medications Ordered in UC Medications - No data to display  Initial Impression / Assessment and Plan / UC Course  I have reviewed the triage vital signs and the nursing notes.  Pertinent labs & imaging results that were available during my care of the patient were reviewed by me and considered in my medical decision making (see chart for details).   Left otitis media, cough.  Treating with amoxicillin.  PCR COVID test performed here.  Instructed patient to self quarantine until the test result is back.  Discussed with patient that he can take Tylenol as needed for fever or discomfort.  Instructed patient to go to the emergency department if he develops high fever, shortness of breath, severe diarrhea, or other concerning symptoms.  Patient agrees with plan of care.     Final Clinical Impressions(s) / UC Diagnoses   Final diagnoses:  Left otitis media, unspecified otitis media type  Cough     Discharge Instructions     Take the amoxicillin as directed.    Your COVID test is pending.  You should self quarantine until the test result is back.    Take Tylenol as needed for fever or discomfort.  Rest and keep yourself hydrated.    Go to the emergency department if you develop shortness of breath, severe diarrhea, high fever not relieved by Tylenol or ibuprofen, or other concerning symptoms.       ED Prescriptions    Medication Sig Dispense Auth. Provider   amoxicillin (AMOXIL) 875 MG tablet Take 1 tablet (875 mg total) by mouth 2 (two) times daily for 10 days. 20 tablet Sharion Balloon, NP     PDMP not reviewed this encounter.   Sharion Balloon, NP 12/09/19 [phone]

## 2019-12-09 NOTE — Discharge Instructions (Signed)
Take the amoxicillin as directed.    Your COVID test is pending.  You should self quarantine until the test result is back.    Take Tylenol as needed for fever or discomfort.  Rest and keep yourself hydrated.    Go to the emergency department if you develop shortness of breath, severe diarrhea, high fever not relieved by Tylenol or ibuprofen, or other concerning symptoms.

## 2019-12-09 NOTE — ED Triage Notes (Signed)
Patient complains of left ear pain and dry cough x1 day. Reports the pain was worse last night rating it 7/10. Reports 2/10 in clinic.   Also reports he had COVID in March 2021.

## 2019-12-10 LAB — SARS-COV-2, NAA 2 DAY TAT

## 2019-12-10 LAB — NOVEL CORONAVIRUS, NAA: SARS-CoV-2, NAA: NOT DETECTED

## 2019-12-16 ENCOUNTER — Other Ambulatory Visit: Payer: Self-pay | Admitting: Family

## 2019-12-16 MED ORDER — AMOXICILLIN-POT CLAVULANATE ER 1000-62.5 MG PO TB12: 2.0000 | ORAL_TABLET | Freq: Two times a day (BID) | ORAL | 0 refills | Status: DC

## 2019-12-16 MED ORDER — CEFDINIR 300 MG PO CAPS
300.0000 mg | ORAL_CAPSULE | Freq: Two times a day (BID) | ORAL | 0 refills | Status: DC
Start: 2019-12-16 — End: 2020-01-07

## 2019-12-18 ENCOUNTER — Other Ambulatory Visit: Payer: Self-pay | Admitting: Family

## 2019-12-18 DIAGNOSIS — H65112 Acute and subacute allergic otitis media (mucoid) (sanguinous) (serous), left ear: Secondary | ICD-10-CM

## 2019-12-18 MED ORDER — PREDNISONE 10 MG PO TABS: ORAL_TABLET | ORAL | 0 refills | Status: DC

## 2019-12-18 MED ORDER — AMOXICILLIN-POT CLAVULANATE ER 1000-62.5 MG PO TB12: 2.0000 | ORAL_TABLET | Freq: Two times a day (BID) | ORAL | 0 refills | Status: AC

## 2019-12-24 ENCOUNTER — Encounter: Payer: Self-pay | Admitting: Gastroenterology

## 2020-01-01 ENCOUNTER — Other Ambulatory Visit: Payer: Self-pay | Admitting: Family

## 2020-01-01 DIAGNOSIS — H6692 Otitis media, unspecified, left ear: Secondary | ICD-10-CM

## 2020-01-01 MED ORDER — PREDNISONE 10 MG PO TABS: ORAL_TABLET | ORAL | 0 refills | Status: DC

## 2020-01-07 ENCOUNTER — Encounter: Payer: Self-pay | Admitting: Gastroenterology

## 2020-01-07 ENCOUNTER — Other Ambulatory Visit: Payer: Self-pay

## 2020-01-07 ENCOUNTER — Ambulatory Visit (AMBULATORY_SURGERY_CENTER): Payer: 59 | Admitting: Gastroenterology

## 2020-01-07 VITALS — BP 112/69 | HR 74 | Temp 97.8°F | Resp 19 | Ht 75.0 in | Wt 202.0 lb

## 2020-01-07 DIAGNOSIS — K2970 Gastritis, unspecified, without bleeding: Secondary | ICD-10-CM

## 2020-01-07 DIAGNOSIS — Q398 Other congenital malformations of esophagus: Secondary | ICD-10-CM

## 2020-01-07 DIAGNOSIS — R12 Heartburn: Secondary | ICD-10-CM

## 2020-01-07 DIAGNOSIS — K219 Gastro-esophageal reflux disease without esophagitis: Secondary | ICD-10-CM

## 2020-01-07 DIAGNOSIS — K208 Other esophagitis without bleeding: Secondary | ICD-10-CM | POA: Diagnosis not present

## 2020-01-07 HISTORY — PX: UPPER GASTROINTESTINAL ENDOSCOPY: SHX188

## 2020-01-07 MED ORDER — SODIUM CHLORIDE 0.9 % IV SOLN
500.0000 mL | Freq: Once | INTRAVENOUS | Status: DC
Start: 2020-01-07 — End: 2020-01-07

## 2020-01-07 NOTE — Patient Instructions (Signed)
YOU HAD AN ENDOSCOPIC PROCEDURE TODAY AT THE [facility]:   Refer to the procedure report that was given to you for any specific questions about what was found during the examination.  If the procedure report does not answer your questions, please call your gastroenterologist to clarify.  If you requested that your care partner not be given the details of your procedure findings, then the procedure report has been included in a sealed envelope for you to review at your convenience later.  YOU SHOULD EXPECT: Some feelings of bloating in the abdomen. Passage of more gas than usual.  Walking can help get rid of the air that was put into your GI tract during the procedure and reduce the bloating. If you had a lower endoscopy (such as a colonoscopy or flexible sigmoidoscopy) you may notice spotting of blood in your stool or on the toilet paper. If you underwent a bowel prep for your procedure, you may not have a normal bowel movement for a few days.  Please Note:  You might notice some irritation and congestion in your nose or some drainage.  This is from the oxygen used during your procedure.  There is no need for concern and it should clear up in a day or so.  SYMPTOMS TO REPORT IMMEDIATELY:    Following upper endoscopy (EGD)  Vomiting of blood or coffee ground material  New chest pain or pain under the shoulder blades  Painful or persistently difficult swallowing  New shortness of breath  Fever of 100F or higher  Black, tarry-looking stools  For urgent or emergent issues, a gastroenterologist can be reached at any hour by calling [phone]. Do not use MyChart messaging for urgent concerns.    DIET:  We do recommend a small meal at first, but then you may proceed to your regular diet.  Drink plenty of fluids but you should avoid alcoholic beverages for 24 hours.  ACTIVITY:  You should plan to take it easy for the rest of today and you should NOT DRIVE or use heavy machinery  until tomorrow (because of the sedation medicines used during the test).    FOLLOW UP: Our staff will call the number listed on your records 48-72 hours following your procedure to check on you and address any questions or concerns that you may have regarding the information given to you following your procedure. If we do not reach you, we will leave a message.  We will attempt to reach you two times.  During this call, we will ask if you have developed any symptoms of COVID 19. If you develop any symptoms (ie: fever, flu-like symptoms, shortness of breath, cough etc.) before then, please call [phone].  If you test positive for Covid 19 in the 2 weeks post procedure, please call and report this information to us.    If any biopsies were taken you will be contacted by phone or by letter within the next 1-3 weeks.  Please call us at [phone] if you have not heard about the biopsies in 3 weeks.    SIGNATURES/CONFIDENTIALITY: You and/or your care partner have signed paperwork which will be entered into your electronic medical record.  These signatures attest to the fact that that the information above on your After Visit Summary has been reviewed and is understood.  Full responsibility of the confidentiality of this discharge information lies with you and/or your care-partner. 

## 2020-01-07 NOTE — Progress Notes (Signed)
Called to room to assist during endoscopic procedure.  Patient ID and intended procedure confirmed with present staff. Received instructions for my participation in the procedure from the performing physician.  

## 2020-01-07 NOTE — Progress Notes (Signed)
Vital signs checked by:CW ? ?The medical and surgical history was reviewed and verified with the patient. ? ?

## 2020-01-07 NOTE — Progress Notes (Signed)
PT taken to PACU. Monitors in place. VSS. Report given to RN. 

## 2020-01-07 NOTE — Op Note (Signed)
[facility] Patient Name: Frank Young Procedure Date: 01/07/2020 10:17 AM MRN: [id number] Endoscopist: Gerrit Heck , MD Age: 34 Referring MD:  Date of Birth: Jun 07, 1985 Gender: Male Account #: [id number] Procedure:                Upper GI endoscopy Indications:              Heartburn, Suspected esophageal reflux,                            Preoperative assessment                           Currently off all acid suppression therapy (Nexium                            and Pepcid) x5 days. Medicines:                Monitored Anesthesia Care Procedure:                Pre-Anesthesia Assessment:                           - Prior to the procedure, a History and Physical                            was performed, and patient medications and                            allergies were reviewed. The patient's tolerance of                            previous anesthesia was also reviewed. The risks                            and benefits of the procedure and the sedation                            options and risks were discussed with the patient.                            All questions were answered, and informed consent                            was obtained. Prior Anticoagulants: The patient has                            taken no previous anticoagulant or antiplatelet                            agents. ASA Grade Assessment: II - A patient with                            mild systemic disease. After reviewing the risks  and benefits, the patient was deemed in                            satisfactory condition to undergo the procedure.                           After obtaining informed consent, the endoscope was                            passed under direct vision. Throughout the                            procedure, the patient's blood pressure, pulse, and                            oxygen saturations were monitored continuously. The                             Endoscope was introduced through the mouth, and                            advanced to the second part of duodenum. The upper                            GI endoscopy was accomplished without difficulty.                            The patient tolerated the procedure well. Scope In: Scope Out: Findings:                 A single area of ectopic gastric mucosa was found                            in the upper third of the esophagus.                           The examined esophagus was otherwise normal.                           The Z-line was regular (flared Z line- normal                            variant) and was found 41 cm from the incisors.                            Biopsies were taken with a cold forceps for                            histology and evaluation of microscopic reflux                            changes. Estimated blood loss was minimal.  The gastroesophageal flap valve was visualized                            endoscopically and classified as Hill Grade II                            (borderline grade III). There was LES laxity,                            measuring 2 cm in transverse width. No hiatal                            hernia noted on anterograde views.                           The entire examined stomach was normal.                           The duodenal bulb, first portion of the duodenum                            and second portion of the duodenum were normal. Complications:            No immediate complications. Estimated Blood Loss:     Estimated blood loss was minimal. Impression:               - Normal esophagus.                           - Z-line regular, 41 cm from the incisors. Biopsied.                           - Gastroesophageal flap valve classified as Hill                            Grade II (fold present, opens with respiration).                            Under full insufflation, the LES laxity measured 2                             cm in transverse width.                           - Normal stomach.                           - Normal duodenal bulb, first portion of the                            duodenum and second portion of the duodenum. Recommendation:           - Patient has a contact number available for                            emergencies.  The signs and symptoms of potential                            delayed complications were discussed with the                            patient. Return to normal activities tomorrow.                            Written discharge instructions were provided to the                            patient.                           - Resume previous diet.                           - Continue present medications. Ok to resume acid                            suppression therapy.                           - Await pathology results.                           - Pending pathology results, will discuss further                            evaluation with pH/Impedance testing and Esophageal                            Manometry (off PPI) to assess the grade and                            severity of reflux as part of the pre-operative                            assessment.                           - Return to GI clinic at appointment to be                            scheduled. Gerrit Heck, MD 01/07/2020 11:17:17 AM

## 2020-01-11 ENCOUNTER — Telehealth: Payer: Self-pay | Admitting: *Deleted

## 2020-01-11 ENCOUNTER — Telehealth: Payer: Self-pay

## 2020-01-11 NOTE — Telephone Encounter (Signed)
°  Follow up Call-  Call back number 01/07/2020  Post procedure Call Back phone  # [phone]  Permission to leave phone message Yes  Some recent data might be hidden     Patient questions:  Message left to call us and that we wold call after lunch.

## 2020-01-11 NOTE — Telephone Encounter (Signed)
2nd follow up call made.  NALM 

## 2020-02-02 ENCOUNTER — Telehealth: Payer: Self-pay | Admitting: Gastroenterology

## 2020-02-02 NOTE — Telephone Encounter (Signed)
Pt is requesting a call back from a nurse regarding his EGD results.

## 2020-02-03 ENCOUNTER — Other Ambulatory Visit: Payer: Self-pay | Admitting: Gastroenterology

## 2020-02-03 DIAGNOSIS — K219 Gastro-esophageal reflux disease without esophagitis: Secondary | ICD-10-CM

## 2020-02-03 NOTE — Telephone Encounter (Signed)
Spoke with patient to inform him of recent EGD biopsy results and Dr De Hollingshead recommendations. He has agreed to proceed with Esophageal Manometry with 24 hr impedance. Patient is scheduled for 05/04/20. Instructions discussed over the phone. He can also review them in MyChart. They have also been mailed out. He will call with any questions or concerns.

## 2020-03-06 DIAGNOSIS — Z20828 Contact with and (suspected) exposure to other viral communicable diseases: Secondary | ICD-10-CM | POA: Diagnosis not present

## 2020-05-02 ENCOUNTER — Telehealth: Payer: Self-pay | Admitting: Gastroenterology

## 2020-05-02 ENCOUNTER — Other Ambulatory Visit (HOSPITAL_COMMUNITY): Payer: 59

## 2020-05-02 ENCOUNTER — Telehealth: Payer: Self-pay | Admitting: General Surgery

## 2020-05-02 NOTE — Telephone Encounter (Signed)
Spoke with the patient and he did not go and have his covid test today. His daughter tested positive and he is now in quarantine, the patient will call back to reschedule.

## 2020-05-02 NOTE — Telephone Encounter (Signed)
Pt is requesting a call back from a nurse to reschedule his colonoscopy schedule at the hospital 12/1.

## 2020-05-02 NOTE — Telephone Encounter (Signed)
Left a message for the patient to contact the office regarding procedure at [facility] 05/04/2020

## 2020-05-04 ENCOUNTER — Ambulatory Visit (HOSPITAL_COMMUNITY): Admission: RE | Admit: 2020-05-04 | Payer: 59 | Source: Home / Self Care | Admitting: Gastroenterology

## 2020-05-04 ENCOUNTER — Encounter (HOSPITAL_COMMUNITY): Admission: RE | Payer: Self-pay | Source: Home / Self Care

## 2020-05-04 SURGERY — MANOMETRY, ESOPHAGUS

## 2020-05-30 ENCOUNTER — Other Ambulatory Visit (HOSPITAL_COMMUNITY)
Admission: RE | Admit: 2020-05-30 | Discharge: 2020-05-30 | Disposition: A | Discharge status: Home / Self Care | Payer: 59 | Source: Ambulatory Visit | Attending: Gastroenterology | Admitting: Gastroenterology

## 2020-05-30 DIAGNOSIS — Z01812 Encounter for preprocedural laboratory examination: Secondary | ICD-10-CM | POA: Diagnosis not present

## 2020-05-30 DIAGNOSIS — Z20822 Contact with and (suspected) exposure to covid-19: Secondary | ICD-10-CM | POA: Insufficient documentation

## 2020-05-30 LAB — SARS CORONAVIRUS 2 (TAT 6-24 HRS): SARS Coronavirus 2: NEGATIVE

## 2020-06-01 ENCOUNTER — Encounter (HOSPITAL_COMMUNITY)
Admission: RE | Disposition: A | Discharge status: Home / Self Care | Payer: Self-pay | Source: Home / Self Care | Attending: Gastroenterology

## 2020-06-01 ENCOUNTER — Ambulatory Visit (HOSPITAL_COMMUNITY)
Admission: RE | Admit: 2020-06-01 | Discharge: 2020-06-01 | Disposition: A | Discharge status: Home / Self Care | Payer: 59 | Attending: Gastroenterology | Admitting: Gastroenterology

## 2020-06-01 DIAGNOSIS — R1013 Epigastric pain: Secondary | ICD-10-CM | POA: Insufficient documentation

## 2020-06-01 DIAGNOSIS — Z79899 Other long term (current) drug therapy: Secondary | ICD-10-CM | POA: Insufficient documentation

## 2020-06-01 DIAGNOSIS — R1110 Vomiting, unspecified: Secondary | ICD-10-CM | POA: Diagnosis not present

## 2020-06-01 DIAGNOSIS — R12 Heartburn: Secondary | ICD-10-CM | POA: Diagnosis not present

## 2020-06-01 DIAGNOSIS — Z8616 Personal history of COVID-19: Secondary | ICD-10-CM | POA: Diagnosis not present

## 2020-06-01 DIAGNOSIS — K219 Gastro-esophageal reflux disease without esophagitis: Secondary | ICD-10-CM | POA: Diagnosis not present

## 2020-06-01 HISTORY — PX: ESOPHAGEAL MANOMETRY: SHX5429

## 2020-06-01 HISTORY — PX: 24 HOUR PH STUDY: SHX5419

## 2020-06-01 SURGERY — MANOMETRY, ESOPHAGUS

## 2020-06-01 MED ORDER — LIDOCAINE HCL URETHRAL/MUCOSAL 2 % EX GEL
CUTANEOUS | Status: AC
  Filled 2020-06-01: qty 30

## 2020-06-01 SURGICAL SUPPLY — 2 items
FACESHIELD LNG OPTICON STERILE (SAFETY) IMPLANT
GLOVE BIO SURGEON STRL SZ8 (GLOVE) ×4 IMPLANT

## 2020-06-01 NOTE — Progress Notes (Signed)
Esophageal manometry performed per protocol.  Patient tolerated procedure without any diffuculties.  PH probe then placed at 40 cm at right nare.  Written and verbal education provided on use of equipment and when to return to Endoscopy to have probe removed.  Patient verbalized understanding of all instructions.  Report to be sent to Dr. Marsa Aris.

## 2020-06-03 ENCOUNTER — Encounter (HOSPITAL_COMMUNITY): Payer: Self-pay | Admitting: Gastroenterology

## 2020-06-07 NOTE — H&P (Signed)
    P  Chief Complaint:    GERD, preoperative assessment  HPI:     Patient is a 35 y.o. male presenting to the Gastroenterology Clinic for follow-up.   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 He presents today for Esophageal Manometry and pH/impedance testing off PPI.  Has history of reflux symptoms since 2017.  Index symptoms of heartburn, regurgitation, dyspepsia. More recently with globus sensation. Trialed HOB elevation/wedge pillow, without improvement. Occasional nocturnal cough and raspy voice.  Has been taking OTC PPI for the last 3+ years.  Was most recently seen by his PCM on 11/19/2019.  Given ongoing breakthrough reflux despite Nexium 20 mg/day and Pepcid 20 mg at bedtime, was referred to GI for further evaluation and consideration of antireflux surgery.  Had Covid-19 in March, after vaccine. Interestingly, he states reflux symptoms have actually gotten worse since Covid vaccine.  Father with GIST ~2011. PGF with CRC in his 52's.    -10/01/2019: Normal CBC, CMP.  H. pylori negative.  B12 low normal (286)-started low-dose OTC B12 supplement  Endoscopic History: -EGD (01/07/2020): Flared Z-line (path: Reflux changes without intestinal metaplasia), Hill grade 2, gastric inlet patch     Review of systems:     No chest pain, no SOB, no fevers, no urinary sx   Past Medical History:  Diagnosis Date  . Allergy    claritin prn  . Anxiety    sometimes gets concerned about medical issues. stress with work.   . Chronic tension headaches    improved with change in pillow- monitoring to see if will still occur  . Depression    [employer]  . GERD (gastroesophageal reflux disease)    prilosec 20mg  since weight gain- wants to be closer to 190 then trial off.     Patient's surgical history, family medical history, social history, medications and allergies were all  reviewed in Epic    No current facility-administered medications for this encounter.   Current Outpatient Medications  Medication Sig Dispense Refill  . B Complex Vitamins (B-COMPLEX/B-12 PO) Take 1,000 mcg by mouth daily.    esomeprazole (NEXIUM) 20 MG capsule Take 1 capsule (20 mg total) by mouth daily. 30 capsule 5  . famotidine (PEPCID) 20 MG tablet Take 20 mg by mouth at bedtime.    . fexofenadine (ALLEGRA) 180 MG tablet Take 180 mg by mouth daily.    . fluticasone (FLONASE) 50 MCG/ACT nasal spray Place 1 spray into both nostrils daily as needed for allergies or rhinitis.    . Multiple Vitamin (MULTIVITAMIN) tablet Take 1 tablet by mouth daily.      Physical Exam:     There were no vitals taken for this visit.    IMPRESSION and PLAN:    1) GERD -Plan for esophageal manometry and pH/impedance testing off PPI as part of preoperative assessment for potential TIF          Marland Kitchen ,DO, FACG 06/07/2020, 10:17 AM

## 2020-06-09 DIAGNOSIS — R12 Heartburn: Secondary | ICD-10-CM

## 2020-06-09 DIAGNOSIS — R1013 Epigastric pain: Secondary | ICD-10-CM

## 2020-07-14 ENCOUNTER — Other Ambulatory Visit (HOSPITAL_COMMUNITY): Payer: Self-pay | Admitting: Otolaryngology

## 2020-07-14 DIAGNOSIS — H6980 Other specified disorders of Eustachian tube, unspecified ear: Secondary | ICD-10-CM | POA: Diagnosis not present

## 2020-07-14 DIAGNOSIS — J301 Allergic rhinitis due to pollen: Secondary | ICD-10-CM | POA: Diagnosis not present

## 2020-07-14 DIAGNOSIS — H9313 Tinnitus, bilateral: Secondary | ICD-10-CM | POA: Diagnosis not present

## 2020-07-28 ENCOUNTER — Other Ambulatory Visit (HOSPITAL_COMMUNITY): Payer: Self-pay | Admitting: General Surgery

## 2020-07-28 DIAGNOSIS — N471 Phimosis: Secondary | ICD-10-CM | POA: Diagnosis not present

## 2020-07-28 DIAGNOSIS — N35911 Unspecified urethral stricture, male, meatal: Secondary | ICD-10-CM | POA: Diagnosis not present

## 2020-07-28 DIAGNOSIS — R300 Dysuria: Secondary | ICD-10-CM | POA: Diagnosis not present

## 2020-07-28 DIAGNOSIS — R309 Painful micturition, unspecified: Secondary | ICD-10-CM | POA: Diagnosis not present

## 2020-07-28 DIAGNOSIS — Z9889 Other specified postprocedural states: Secondary | ICD-10-CM | POA: Diagnosis not present

## 2020-07-28 DIAGNOSIS — L900 Lichen sclerosus et atrophicus: Secondary | ICD-10-CM | POA: Diagnosis not present

## 2020-09-26 ENCOUNTER — Other Ambulatory Visit: Payer: Self-pay

## 2020-09-26 DIAGNOSIS — N35911 Unspecified urethral stricture, male, meatal: Secondary | ICD-10-CM | POA: Diagnosis not present

## 2020-09-26 MED FILL — Azelastine HCl Nasal Spray 0.1% (137 MCG/SPRAY): NASAL | 30 days supply | Qty: 30 | Fill #0 | Status: AC

## 2020-11-01 ENCOUNTER — Other Ambulatory Visit: Payer: Self-pay

## 2020-11-01 MED FILL — Azelastine HCl Nasal Spray 0.1% (137 MCG/SPRAY): NASAL | 30 days supply | Qty: 30 | Fill #1 | Status: AC

## 2020-11-29 ENCOUNTER — Other Ambulatory Visit: Payer: Self-pay

## 2020-11-29 MED FILL — Azelastine HCl Nasal Spray 0.1% (137 MCG/SPRAY): NASAL | 30 days supply | Qty: 30 | Fill #2 | Status: AC

## 2020-12-30 DIAGNOSIS — N35911 Unspecified urethral stricture, male, meatal: Secondary | ICD-10-CM | POA: Diagnosis not present

## 2020-12-30 DIAGNOSIS — N35811 Other urethral stricture, male, meatal: Secondary | ICD-10-CM | POA: Diagnosis not present

## 2021-01-04 ENCOUNTER — Other Ambulatory Visit: Payer: Self-pay

## 2021-01-04 MED FILL — Azelastine HCl Nasal Spray 0.1% (137 MCG/SPRAY): NASAL | 30 days supply | Qty: 30 | Fill #3 | Status: AC

## 2021-02-06 MED FILL — Azelastine HCl Nasal Spray 0.1% (137 MCG/SPRAY): NASAL | 30 days supply | Qty: 30 | Fill #4 | Status: AC

## 2021-02-07 ENCOUNTER — Other Ambulatory Visit: Payer: Self-pay

## 2021-03-10 ENCOUNTER — Other Ambulatory Visit: Payer: Self-pay

## 2021-03-10 MED FILL — Azelastine HCl Nasal Spray 0.1% (137 MCG/SPRAY): NASAL | 30 days supply | Qty: 30 | Fill #5 | Status: AC

## 2021-04-12 ENCOUNTER — Other Ambulatory Visit: Payer: Self-pay

## 2021-04-12 MED FILL — Azelastine HCl Nasal Spray 0.1% (137 MCG/SPRAY): NASAL | 30 days supply | Qty: 30 | Fill #6 | Status: AC

## 2021-04-13 ENCOUNTER — Other Ambulatory Visit: Payer: Self-pay

## 2021-04-13 ENCOUNTER — Ambulatory Visit (INDEPENDENT_AMBULATORY_CARE_PROVIDER_SITE_OTHER): Payer: 59 | Admitting: Family Medicine

## 2021-04-13 ENCOUNTER — Ambulatory Visit: Payer: Self-pay

## 2021-04-13 VITALS — BP 146/84 | HR 75 | Ht 75.0 in | Wt 192.2 lb

## 2021-04-13 DIAGNOSIS — G8929 Other chronic pain: Secondary | ICD-10-CM | POA: Diagnosis not present

## 2021-04-13 DIAGNOSIS — M25522 Pain in left elbow: Secondary | ICD-10-CM

## 2021-04-13 DIAGNOSIS — G5622 Lesion of ulnar nerve, left upper limb: Secondary | ICD-10-CM | POA: Diagnosis not present

## 2021-04-13 NOTE — Progress Notes (Signed)
    Subjective:    CC: L elbow pain  I, Molly Weber, LAT, ATC, am serving as scribe for Dr. Lynne Leader.  HPI: Pt is a 35 y/o male presenting w/ L elbow pain ongoing since mid-Sept intermittently.  Pt describes pain and a "dull ache" across the medial aspect of the L elbow w/ paresthesia into the L 5th finger. He locates his pain to medial aspect of the L elbow . His major issue is paresthesias to the ulnar hand. He is an avid right-handed golfer. 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  L elbow swelling: no Numbness/tingling: yes- into 5th finger Radiating pain: no Aggravating factors: nothing in particular Treatments tried: IBU  Pertinent review of Systems: No fevers or chills  Relevant historical information: GERD.   Objective:    Vitals:   04/13/21 1011  BP: (!) 146/84  Pulse: 75  SpO2: 97%   General: Well Developed, well nourished, and in no acute distress.   MSK: Left elbow: Normal-appearing Normal motion. Minimally tender at the medial epicondyle. Normal strength. Positive Tinel's at cubital tunnel. Distal hand motion strength and sensation is intact. Unable to reproduce medial elbow pain with resisted wrist and hand flexion.  Lab and Radiology Results  Diagnostic Limited MSK Ultrasound of: Left medial elbow Medial epicondyle, common flexor tendon origin normal-appearing with no obvious tear.   No obvious sign of insertional tendinitis. Cubital tunnel shows an enlarged ulnar nerve.  Dynamic motion testing does not show clear subluxation of the nerve out of the cubital tunnel. Impression: Cubital tunnel syndrome.  Lab Results  Component Value Date   VITAMINB12 286 10/01/2019     Impression and Recommendations:    Assessment and Plan: 35 y.o. male with left elbow pain and distal ulnar paresthesia consistent with cubital tunnel syndrome.  Discussed treatment plan and options. Will treat with simple conservative  management options at first.  We will use a cubital tunnel night splint to keep the elbow in extension and avoid ulnar nerve compression with flexion.  If this is not sufficient after a month or 6 weeks or so next step could be steroid injection into the cubital tunnel area.  Discussed trial of gabapentin patient declined for now. He will let me know if he needs further evaluation or management or treatment in the future.  PDMP not reviewed this encounter. Orders Placed This Encounter  Procedures   [place] LIMITED JOINT SPACE STRUCTURES UP LEFT(NO LINKED CHARGES)    Standing Status:   Future    Number of Occurrences:   1    Standing Expiration Date:   10/11/2021    Order Specific Question:   Reason for Exam (SYMPTOM  OR DIAGNOSIS REQUIRED)    Answer:   left elbow pain    Order Specific Question:   Preferred imaging location?    Answer:   [place]   No orders of the defined types were placed in this encounter.   Discussed warning signs or symptoms. Please see discharge instructions. Patient expresses understanding.   The above documentation has been reviewed and is accurate and complete Lynne Leader, M.D.

## 2021-04-13 NOTE — Patient Instructions (Addendum)
Thank you for coming in today.   Use the cubital tunnel splint at bed time.  Recheck back as needed

## 2021-04-20 ENCOUNTER — Other Ambulatory Visit: Payer: Self-pay

## 2021-04-20 DIAGNOSIS — N35911 Unspecified urethral stricture, male, meatal: Secondary | ICD-10-CM | POA: Diagnosis not present

## 2021-04-20 DIAGNOSIS — L900 Lichen sclerosus et atrophicus: Secondary | ICD-10-CM | POA: Diagnosis not present

## 2021-04-20 MED ORDER — BETAMETHASONE DIPROPIONATE AUG 0.05 % EX OINT
TOPICAL_OINTMENT | CUTANEOUS | 1 refills | Status: DC
  Filled 2021-04-20: qty 30, 30d supply, fill #0
  Filled 2021-06-16: qty 30, 20d supply, fill #0
  Filled 2022-02-16: qty 30, 15d supply, fill #1
  Filled 2022-03-16: qty 30, 20d supply, fill #1

## 2021-04-21 ENCOUNTER — Other Ambulatory Visit: Payer: Self-pay

## 2021-05-05 ENCOUNTER — Other Ambulatory Visit: Payer: Self-pay

## 2021-05-15 ENCOUNTER — Other Ambulatory Visit: Payer: Self-pay

## 2021-05-15 MED FILL — Azelastine HCl Nasal Spray 0.1% (137 MCG/SPRAY): NASAL | 30 days supply | Qty: 30 | Fill #7 | Status: AC

## 2021-06-15 ENCOUNTER — Other Ambulatory Visit: Payer: Self-pay

## 2021-06-15 MED FILL — Azelastine HCl Nasal Spray 0.1% (137 MCG/SPRAY): NASAL | 30 days supply | Qty: 30 | Fill #8 | Status: AC

## 2021-06-16 ENCOUNTER — Other Ambulatory Visit: Payer: Self-pay

## 2021-07-30 IMAGING — DX DG THORACIC SPINE 3V
3 series · 3 of 3 positions shown · non-contrast
Comparison: Rib series and chest x-ray 05/22/2018.

CLINICAL DATA: Thoracolumbar spine pain.

EXAM:
THORACIC SPINE - 3 VIEWS

[thoracic spine ap]
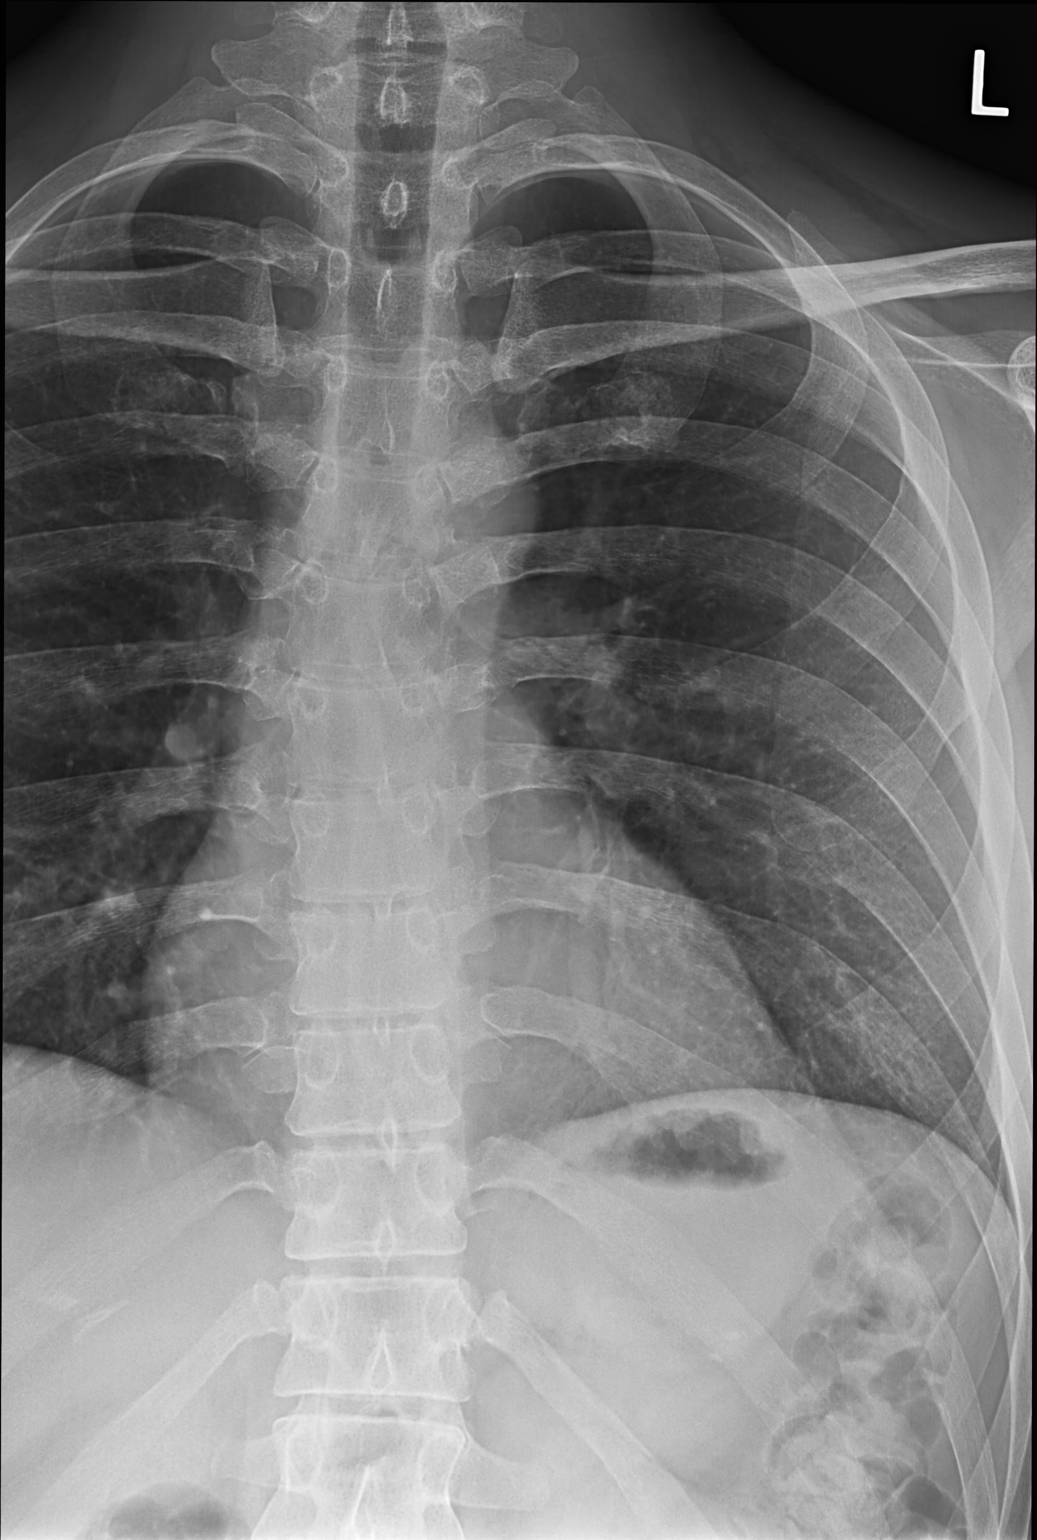

[thoracic spine lat]
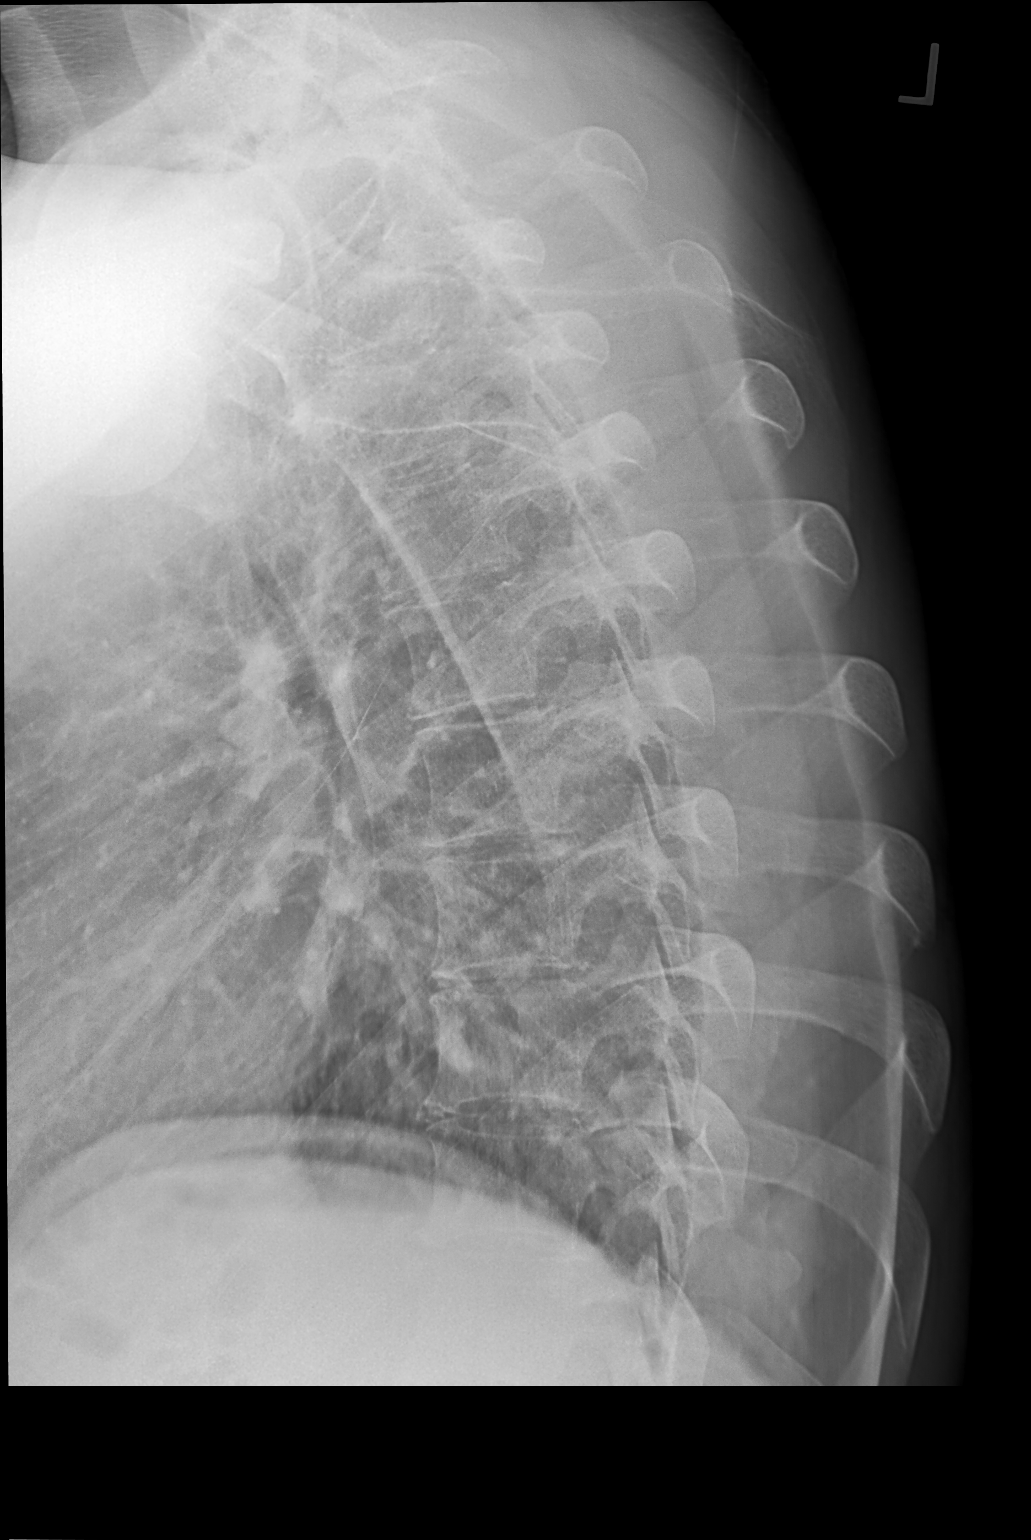

[swimmers lat]
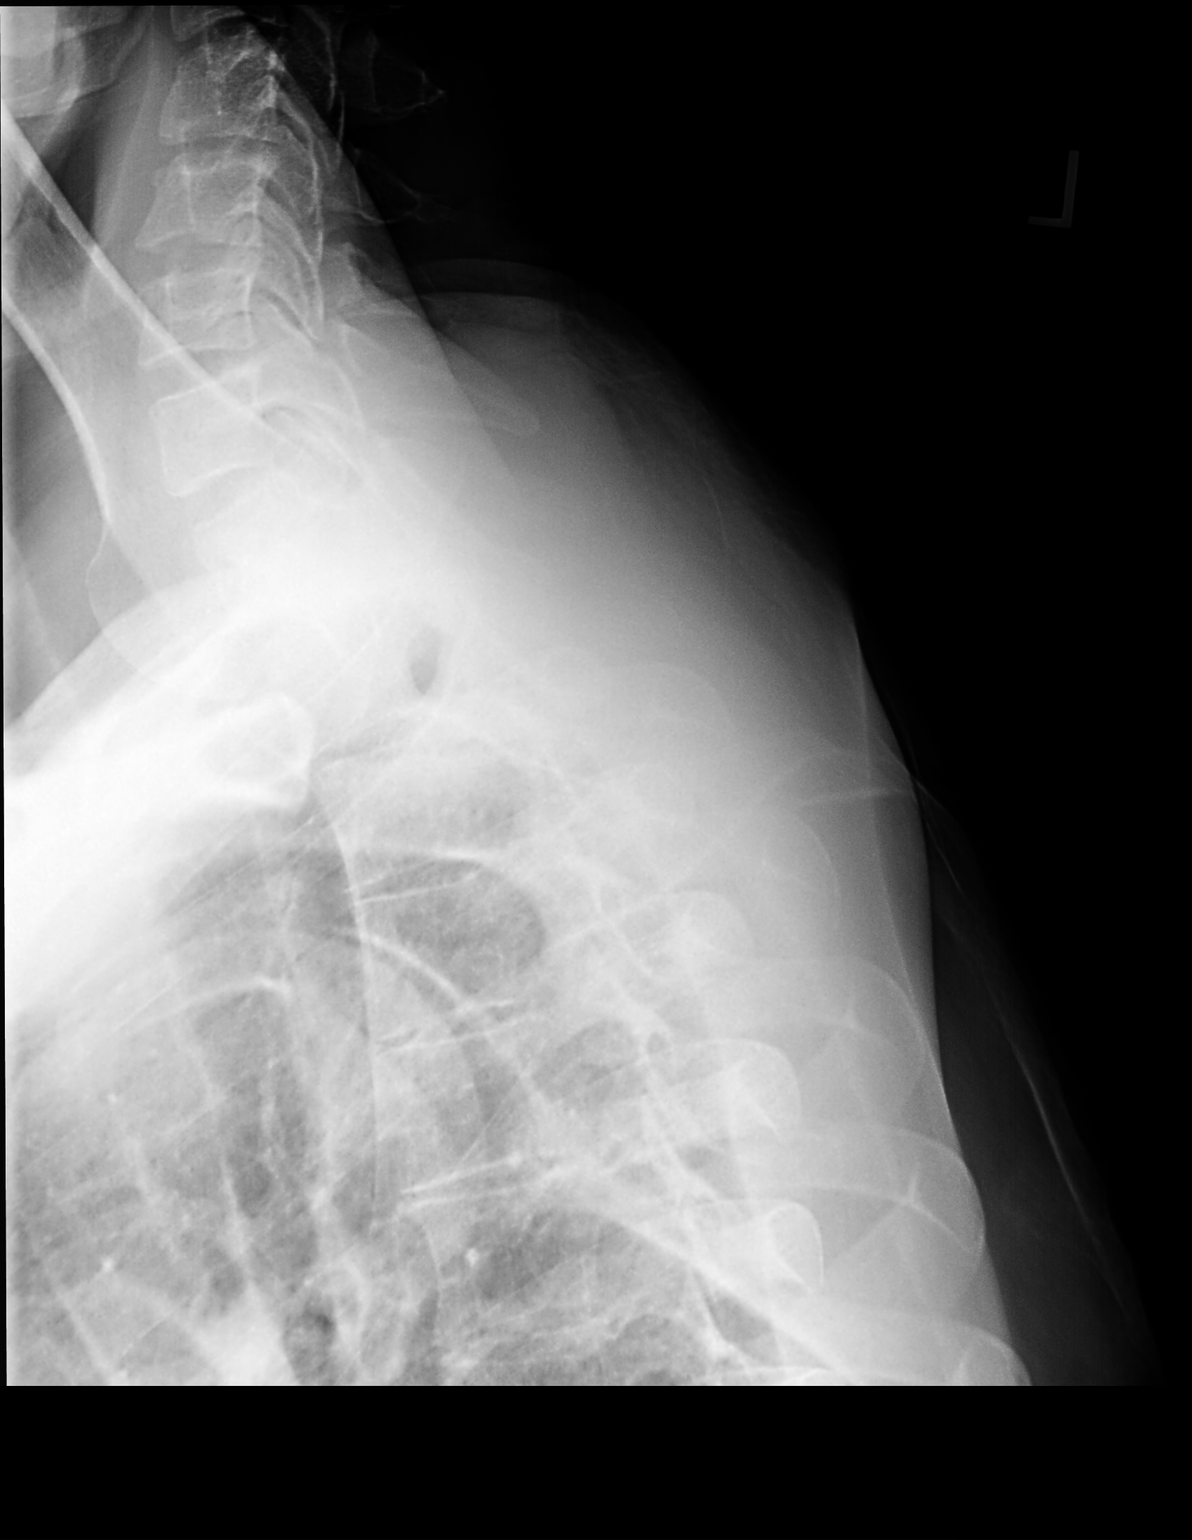

[3 of 3 positions shown; findings below may reference images not displayed]

FINDINGS: Mild thoracic spine scoliosis concave left. Diffuse mild
degenerative change. No acute bony abnormality identified.
Paraspinal soft tissues are unremarkable.
IMPRESSION: Mild thoracic spine scoliosis concave left. Diffuse mild
degenerative change. No acute abnormality identified.

## 2021-08-22 ENCOUNTER — Telehealth: Payer: 59 | Admitting: Family Medicine

## 2021-08-22 ENCOUNTER — Other Ambulatory Visit: Payer: Self-pay

## 2021-08-22 ENCOUNTER — Encounter: Payer: Self-pay | Admitting: Family Medicine

## 2021-08-22 VITALS — Ht 75.0 in | Wt 187.0 lb

## 2021-08-22 DIAGNOSIS — K219 Gastro-esophageal reflux disease without esophagitis: Secondary | ICD-10-CM

## 2021-08-22 DIAGNOSIS — J301 Allergic rhinitis due to pollen: Secondary | ICD-10-CM | POA: Diagnosis not present

## 2021-08-22 MED ORDER — AZELASTINE HCL 0.1 % NA SOLN
NASAL | 12 refills | Status: DC
  Filled 2021-08-22: qty 30, 30d supply, fill #0
  Filled 2021-09-21: qty 30, 30d supply, fill #1
  Filled 2021-10-19: qty 30, 30d supply, fill #2
  Filled 2021-11-16: qty 30, 30d supply, fill #3
  Filled 2021-12-14: qty 30, 30d supply, fill #4
  Filled 2022-01-14: qty 30, 30d supply, fill #5
  Filled 2022-02-16: qty 30, 30d supply, fill #6
  Filled 2022-03-16: qty 30, 30d supply, fill #7
  Filled 2022-04-20 – 2022-05-09 (×2): qty 30, 30d supply, fill #8

## 2021-08-22 MED ORDER — ESOMEPRAZOLE MAGNESIUM 40 MG PO CPDR
40.0000 mg | DELAYED_RELEASE_CAPSULE | Freq: Every day | ORAL | 11 refills | Status: DC
  Filled 2021-08-22: qty 30, 30d supply, fill #0
  Filled 2021-09-21: qty 30, 30d supply, fill #1
  Filled 2021-10-19: qty 30, 30d supply, fill #2
  Filled 2021-11-16: qty 30, 30d supply, fill #3
  Filled 2021-12-27: qty 30, 30d supply, fill #4

## 2021-08-22 NOTE — Progress Notes (Signed)
?Phone [phone] ?Virtual visit via Video note ?  ?Subjective:  ?Chief complaint: ?Chief Complaint  ?Patient presents with  ? Gastroesophageal Reflux  ?  Pt c/o burning in chest after eating or comes and goes without eating that started in Feb, he has been taking Nexium otc bid.  ? ? ?This visit type was conducted due to national recommendations for restrictions regarding the COVID-19 Pandemic (e.g. social distancing).  This format is felt to be most appropriate for this patient at this time balancing risks to patient and risks to population by having him in for in person visit.  No physical exam was performed (except for noted visual exam or audio findings with Telehealth visits).   ? ?Our team/I connected with Frank Young at  4:00 PM EDT by a video enabled telemedicine application (doxy.me or caregility through epic) and verified that I am speaking with the correct person using two identifiers.  ?Location patient: Home[place] ?Location provider: [facility], office ?Persons participating in the virtual visit:  patient ? ?Our team/I discussed the limitations of evaluation and management by telemedicine and the availability of in person appointments. In light of current covid-19 pandemic, patient also understands that we are trying to protect them by minimizing in office contact if at all possible.  The patient expressed consent for telemedicine visit and agreed to proceed. Patient understands insurance will be billed.  ? ?Past Medical History-  ?Patient Active Problem List  ? Diagnosis Date Noted  ? Cubital tunnel syndrome, left 04/13/2021  ? Dyspepsia   ? Heartburn   ? Anxiety   ? GERD (gastroesophageal reflux disease)   ? Chronic tension headaches   ? Allergic rhinitis   ? ? ?Medications- reviewed and updated ?Current Outpatient Medications  ?Medication Sig Dispense Refill  ? augmented betamethasone dipropionate (DIPROLENE-AF) 0.05 % ointment Apply topically once as needed. 30 g 1  ? azelastine (ASTELIN)  0.1 % nasal spray PLACE 1-2 SPRAYS IN EACH NOSTRIL TWICE DAILY 30 mL 12  ? B Complex Vitamins (B-COMPLEX/B-12 PO) Take 1,000 mcg by mouth daily.    ? esomeprazole (NEXIUM) 40 MG capsule Take 1 capsule (40 mg total) by mouth daily. 30 capsule 11  ? fexofenadine (ALLEGRA) 180 MG tablet Take 180 mg by mouth daily.    ? fluticasone (FLONASE) 50 MCG/ACT nasal spray Place 1 spray into both nostrils daily as needed for allergies or rhinitis.    ? ?No current facility-administered medications for this visit.  ? ?  ?Objective:  ?Ht '6\' 3"'$  (1.905 m)   Wt 187 lb (84.8 kg)   BMI 23.37 kg/m?  self reported vitals ?Gen: NAD, resting comfortably ?Lungs: nonlabored, normal respiratory rate  ?Skin: appears dry, no obvious rash ? ?  ? ?Assessment and Plan  ? ?# GERD ?S:Medication: nexium 20 mg 30 mins before breakfast (when doing this alone was having a lot of flares with burning in chest) now doing twice a day- this has calmed down all of his symptoms for 3-4 weeks as long as he takes consistently- some issues if doesn't take med right on time or has afternoon snack ?- work stress contributing - when he was off was doing well and as soon as restarted work after baby was born- started back with reflux ?- had considered TIF in past but symptoms had improved but he cut down on coffee significantly and alcohol and that helped ?  ?B12 levels related to PPI use: low normal- takes b12 otc  ?Lab Results  ?Component Value  Date  ? VITAMINB12 286 10/01/2019  ?A/P: Frank Young poor control - change to nexium '40mg'$  daily. 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 ?# seasonal allergies ?S:saw ENT last year for some equilibrium issues- was eustachian tube dysfunction- was placed on astelin - far better than flonase alone   ?-tried astapro and had flare and did not liek the taste.   ?-also still on allegra.  ?A/P: reasonable control- continue current meds- refill medications at this time  ? ?Recommended follow up:  will schedule physical in 4-6 months ? ?Lab/Order associations: ?  ICD-10-CM   ?1. Gastroesophageal reflux disease without esophagitis  K21.9   ?  ?2. Seasonal allergic rhinitis due to pollen  J30.1   ?  ? ? ?Meds ordered this encounter  ?Medications  ? azelastine (ASTELIN) 0.1 % nasal spray  ?  Sig: PLACE 1-2 SPRAYS IN EACH NOSTRIL TWICE DAILY  ?  Dispense:  30 mL  ?  Refill:  12  ? esomeprazole (NEXIUM) 40 MG capsule  ?  Sig: Take 1 capsule (40 mg total) by mouth daily.  ?  Dispense:  30 capsule  ?  Refill:  11  ? ? ?Return precautions advised.  ?Garret Reddish, MD ? ?

## 2021-08-23 ENCOUNTER — Other Ambulatory Visit: Payer: Self-pay

## 2021-09-21 ENCOUNTER — Other Ambulatory Visit: Payer: Self-pay

## 2021-10-03 ENCOUNTER — Ambulatory Visit (INDEPENDENT_AMBULATORY_CARE_PROVIDER_SITE_OTHER): Payer: 59

## 2021-10-03 ENCOUNTER — Ambulatory Visit (INDEPENDENT_AMBULATORY_CARE_PROVIDER_SITE_OTHER): Payer: 59 | Admitting: Family Medicine

## 2021-10-03 ENCOUNTER — Encounter: Payer: Self-pay | Admitting: Family Medicine

## 2021-10-03 ENCOUNTER — Other Ambulatory Visit: Payer: Self-pay

## 2021-10-03 VITALS — BP 128/78 | HR 68 | Ht 75.0 in | Wt 198.3 lb

## 2021-10-03 DIAGNOSIS — M5442 Lumbago with sciatica, left side: Secondary | ICD-10-CM

## 2021-10-03 DIAGNOSIS — M545 Low back pain, unspecified: Secondary | ICD-10-CM | POA: Diagnosis not present

## 2021-10-03 MED ORDER — GABAPENTIN 300 MG PO CAPS
300.0000 mg | ORAL_CAPSULE | Freq: Three times a day (TID) | ORAL | 2 refills | Status: DC
  Filled 2021-10-03: qty 90, 30d supply, fill #0

## 2021-10-03 MED ORDER — PREDNISONE 50 MG PO TABS
50.0000 mg | ORAL_TABLET | Freq: Every day | ORAL | 0 refills | Status: AC
  Filled 2021-10-03: qty 5, 5d supply, fill #0

## 2021-10-03 NOTE — Patient Instructions (Addendum)
Good to see you today. ? ?I've prescribed you Gabapentin and prednisone. ? ?I've referred you to Physical Therapy.  Their office will call you to schedule but please let us know if you haven't heard from them in one week regarding scheduling. ? ?Please get an Xray today before you leave. ? ?Follow-up: as needed ?

## 2021-10-03 NOTE — Progress Notes (Signed)
? ?I, Frank Young, LAT, ATC, am serving as scribe for Dr. Lynne Leader. ? ?Frank Young is a 36 y.o. male who presents to [place] at [facility] today for LBP. Pt was previously seen by Dr. Georgina Snell on 04/13/21 for L cubital tunnel syndrome. Today, pt c/o LBP x 4-6 weeks. Pt locates pain to his L lower back/SIJ. ? ?Radiating pain: intermittently yes along the L lateral leg ?L LE numbness/tingling: yes in his L lateral foot mainly but sometimes in the L lateral lower leg ?L LE weakness: no ?Aggravating factors: walking while playing golf especially later in the round; after lower body lifting ?Treatments tried: decreased load on weights; IBU; stretching ? ?Dx imaging: 11/19/19 L-spine & T-spine XR ? ?Pertinent review of systems: No fevers or chills ? ?Relevant historical information: Headaches ? ? ?Exam:  ?BP 128/78 (BP Location: Right Arm, Patient Position: Sitting, Cuff Size: Normal)   Pulse 68   Ht '6\' 3"'$  (1.905 m)   Wt 198 lb 4.8 oz (89.9 kg)   SpO2 97%   BMI 24.79 kg/m?  ?General: Well Developed, well nourished, and in no acute distress.  ? ?MSK: L-spine: Nontender midline.   ?Normal lumbar motion ?Lower extremity strength is intact. ?Reflexes are intact. ?Mildly positive straight leg raise test. ?Normal gait. ? ? ? ?Lab and Radiology Results ? ?X-ray images L-spine obtained today personally and independently interpreted. ?Mild DDD L5-S1.  No acute fractures. ?Await formal radiology review ? ? ? ?Assessment and Plan: ?36 y.o. male with left S1 radiculopathy.  Symptoms ongoing for several months now.  The radicular symptoms are getting to be bad enough that they are interfering with exercise.  Plan for course of prednisone and gabapentin now.  Additionally will refer to physical therapy.  If not improving consider in time for an important golf trip in about 6 weeks can repeat course of prednisone.  If not improving overall MRI would be next step for epidural steroid injection  planning. ? ? ?PDMP not reviewed this encounter. ?Orders Placed This Encounter  ?Procedures  ? DG Lumbar Spine 2-3 Views  ?  Standing Status:   Future  ?  Number of Occurrences:   1  ?  Standing Expiration Date:   11/03/2021  ?  Order Specific Question:   Reason for Exam (SYMPTOM  OR DIAGNOSIS REQUIRED)  ?  Answer:   low back pain  ?  Order Specific Question:   Preferred imaging location?  ?  Answer:   [facility]  ? Ambulatory referral to Physical Therapy  ?  Referral Priority:   Routine  ?  Referral Type:   Physical Medicine  ?  Referral Reason:   Specialty Services Required  ?  Requested Specialty:   Physical Therapy  ?  Number of Visits Requested:   1  ? ?Meds ordered this encounter  ?Medications  ? predniSONE (DELTASONE) 50 MG tablet  ?  Sig: Take 1 tablet (50 mg total) by mouth daily with breakfast for 5 days.  ?  Dispense:  5 tablet  ?  Refill:  0  ? gabapentin (NEURONTIN) 300 MG capsule  ?  Sig: Take 1 capsule (300 mg total) by mouth 3 (three) times daily.  ?  Dispense:  90 capsule  ?  Refill:  2  ? ? ? ?Discussed warning signs or symptoms. Please see discharge instructions. Patient expresses understanding. ? ? ?The above documentation has been reviewed and is accurate and complete Lynne Leader, M.D. ? ? ?

## 2021-10-04 DIAGNOSIS — M5442 Lumbago with sciatica, left side: Secondary | ICD-10-CM | POA: Insufficient documentation

## 2021-10-05 NOTE — Progress Notes (Signed)
Lumbar spine x-ray shows a little bit of arthritis changes at L5-S1.

## 2021-10-12 DIAGNOSIS — M5127 Other intervertebral disc displacement, lumbosacral region: Secondary | ICD-10-CM | POA: Diagnosis not present

## 2021-10-20 ENCOUNTER — Other Ambulatory Visit: Payer: Self-pay

## 2021-10-24 DIAGNOSIS — M5127 Other intervertebral disc displacement, lumbosacral region: Secondary | ICD-10-CM | POA: Diagnosis not present

## 2021-11-07 ENCOUNTER — Other Ambulatory Visit: Payer: Self-pay

## 2021-11-07 ENCOUNTER — Telehealth: Payer: 59 | Admitting: Physician Assistant

## 2021-11-07 DIAGNOSIS — J019 Acute sinusitis, unspecified: Secondary | ICD-10-CM | POA: Diagnosis not present

## 2021-11-07 DIAGNOSIS — M5127 Other intervertebral disc displacement, lumbosacral region: Secondary | ICD-10-CM | POA: Diagnosis not present

## 2021-11-07 DIAGNOSIS — B9689 Other specified bacterial agents as the cause of diseases classified elsewhere: Secondary | ICD-10-CM

## 2021-11-07 MED ORDER — AMOXICILLIN-POT CLAVULANATE 875-125 MG PO TABS
1.0000 | ORAL_TABLET | Freq: Two times a day (BID) | ORAL | 0 refills | Status: DC
  Filled 2021-11-07: qty 14, 7d supply, fill #0

## 2021-11-07 NOTE — Progress Notes (Signed)

## 2021-11-10 ENCOUNTER — Encounter: Payer: Self-pay | Admitting: Family Medicine

## 2021-11-14 DIAGNOSIS — M5127 Other intervertebral disc displacement, lumbosacral region: Secondary | ICD-10-CM | POA: Diagnosis not present

## 2021-11-16 ENCOUNTER — Other Ambulatory Visit: Payer: Self-pay

## 2021-12-14 ENCOUNTER — Other Ambulatory Visit: Payer: Self-pay

## 2021-12-27 ENCOUNTER — Other Ambulatory Visit: Payer: Self-pay

## 2021-12-28 ENCOUNTER — Other Ambulatory Visit: Payer: Self-pay

## 2021-12-28 ENCOUNTER — Encounter: Payer: Self-pay | Admitting: Family Medicine

## 2021-12-28 ENCOUNTER — Ambulatory Visit (INDEPENDENT_AMBULATORY_CARE_PROVIDER_SITE_OTHER): Payer: 59 | Admitting: Family Medicine

## 2021-12-28 VITALS — BP 100/80 | HR 62 | Temp 98.3°F | Ht 75.0 in | Wt 193.0 lb

## 2021-12-28 DIAGNOSIS — Z79899 Other long term (current) drug therapy: Secondary | ICD-10-CM

## 2021-12-28 DIAGNOSIS — Z13220 Encounter for screening for lipoid disorders: Secondary | ICD-10-CM | POA: Diagnosis not present

## 2021-12-28 DIAGNOSIS — K219 Gastro-esophageal reflux disease without esophagitis: Secondary | ICD-10-CM

## 2021-12-28 DIAGNOSIS — Z0000 Encounter for general adult medical examination without abnormal findings: Secondary | ICD-10-CM | POA: Diagnosis not present

## 2021-12-28 LAB — LIPID PANEL
Cholesterol: 158 mg/dL (ref 0–200)
HDL: 49.3 mg/dL (ref >39.00)
LDL Cholesterol: 89 mg/dL (ref 0–99)
NonHDL: 108.24
Total CHOL/HDL Ratio: 3
Triglycerides: 95 mg/dL (ref 0.0–149.0)
VLDL: 19 mg/dL (ref 0.0–40.0)

## 2021-12-28 LAB — CBC WITH DIFFERENTIAL/PLATELET
Basophils Absolute: 0 10*3/uL (ref 0.0–0.1)
Basophils Relative: 0.5 % (ref 0.0–3.0)
Eosinophils Absolute: 0.1 10*3/uL (ref 0.0–0.7)
Eosinophils Relative: 1.4 % (ref 0.0–5.0)
HCT: 48.6 % (ref 39.0–52.0)
Hemoglobin: 16.1 g/dL (ref 13.0–17.0)
Lymphocytes Relative: 33.3 % (ref 12.0–46.0)
Lymphs Abs: 1.6 10*3/uL (ref 0.7–4.0)
MCHC: 33.1 g/dL (ref 30.0–36.0)
MCV: 94 fl (ref 78.0–100.0)
Monocytes Absolute: 0.4 10*3/uL (ref 0.1–1.0)
Monocytes Relative: 8.9 % (ref 3.0–12.0)
Neutro Abs: 2.8 10*3/uL (ref 1.4–7.7)
Neutrophils Relative %: 55.9 % (ref 43.0–77.0)
Platelets: 241 10*3/uL (ref 150.0–400.0)
RBC: 5.17 Mil/uL (ref 4.22–5.81)
RDW: 13.4 % (ref 11.5–15.5)
WBC: 4.9 10*3/uL (ref 4.0–10.5)

## 2021-12-28 LAB — COMPREHENSIVE METABOLIC PANEL
ALT: 36 U/L (ref 0–53)
AST: 27 U/L (ref 0–37)
Albumin: 4.9 g/dL (ref 3.5–5.2)
Alkaline Phosphatase: 48 U/L (ref 39–117)
BUN: 14 mg/dL (ref 6–23)
CO2: 29 mEq/L (ref 19–32)
Calcium: 10 mg/dL (ref 8.4–10.5)
Chloride: 104 mEq/L (ref 96–112)
Creatinine, Ser: 1.07 mg/dL (ref 0.40–1.50)
GFR: 88.5 mL/min (ref >60.00)
Glucose, Bld: 87 mg/dL (ref 70–99)
Potassium: 5.6 mEq/L — ABNORMAL HIGH (ref 3.5–5.1)
Sodium: 142 mEq/L (ref 135–145)
Total Bilirubin: 0.9 mg/dL (ref 0.2–1.2)
Total Protein: 7.9 g/dL (ref 6.0–8.3)

## 2021-12-28 LAB — VITAMIN B12: Vitamin B-12: 987 pg/mL — ABNORMAL HIGH (ref 211–911)

## 2021-12-28 LAB — VITAMIN D 25 HYDROXY (VIT D DEFICIENCY, FRACTURES): VITD: 35.74 ng/mL (ref 30.00–100.00)

## 2021-12-28 MED ORDER — ESOMEPRAZOLE MAGNESIUM 40 MG PO CPDR
40.0000 mg | DELAYED_RELEASE_CAPSULE | Freq: Every day | ORAL | 3 refills | Status: DC
  Filled 2021-12-28: qty 90, 90d supply, fill #0
  Filled 2022-03-29: qty 90, 90d supply, fill #1
  Filled 2022-06-29: qty 90, 90d supply, fill #2

## 2021-12-28 NOTE — Patient Instructions (Addendum)
Please stop by lab before you go If you have mychart- we will send your results within 3 business days of [place] receiving them.  If you do not have mychart- we will call you about results within 5 business days of [place] receiving them.  *please also note that you will see labs on mychart as soon as they post. I will later go in and write notes on them- will say "notes from Dr. Yong Channel"   Recommended follow up: Return in about 1 year (around 12/29/2022) for physical or sooner if needed.Schedule b4 you leave.

## 2021-12-28 NOTE — Progress Notes (Signed)
Phone: [phone]    Subjective:  Patient presents today for their annual physical. Chief complaint-noted.   See problem oriented charting- ROS- full  review of systems was completed and negative  Per full ROS sheet completed by patient- doing well as long as takes full dose of nexium 40 mg  The following were reviewed and entered/updated in epic: Past Medical History:  Diagnosis Date   Allergy    claritin prn   Anxiety    sometimes gets concerned about medical issues. stress with work.    Chronic tension headaches    improved with change in pillow- monitoring to see if will still occur   Depression    [employer]   GERD (gastroesophageal reflux disease)    prilosec '20mg'$  since weight gain- wants to be closer to 190 then trial off.    Patient Active Problem List   Diagnosis Date Noted   Anxiety     Priority: Medium    GERD (gastroesophageal reflux disease)     Priority: Medium    Chronic tension headaches     Priority: Medium    Acute left-sided low back pain with left-sided sciatica 10/04/2021    Priority: Low   Cubital tunnel syndrome, left 04/13/2021    Priority: Low   Allergic rhinitis     Priority: Low   Past Surgical History:  Procedure Laterality Date   29 HOUR [place] STUDY N/A 06/01/2020   Procedure: 24 HOUR [place] STUDY;  Surgeon: Lavena Bullion, DO;  Location: [facility];  Service: Gastroenterology;  Laterality: N/A;   CIRCUMCISION  08/2018   ESOPHAGEAL MANOMETRY N/A 06/01/2020   Procedure: ESOPHAGEAL MANOMETRY (EM);  Surgeon: Lavena Bullion, DO;  Location: [facility];  Service: Gastroenterology;  Laterality: N/A;   UPPER GASTROINTESTINAL ENDOSCOPY  01/07/2020   WISDOM TOOTH EXTRACTION Bilateral 2002    Family History  Problem Relation Age of Onset   Healthy Mother    Cancer Father        GIST   Lung cancer Maternal Grandmother        smoker   Diabetes Maternal Grandfather    Stroke Maternal Grandfather    Skin cancer Paternal Grandmother     Arthritis/Rheumatoid Paternal Grandmother    Skin cancer Paternal Grandfather    Heart disease Paternal Grandfather        CABG in 62s   Colon cancer Paternal Grandfather    Testicular cancer Paternal Uncle    Hypertension Paternal Uncle    Hyperlipidemia Paternal Uncle    Esophageal cancer Neg Hx    Rectal cancer Neg Hx    Stomach cancer Neg Hx     Medications- reviewed and updated Current Outpatient Medications  Medication Sig Dispense Refill   augmented betamethasone dipropionate (DIPROLENE-AF) 0.05 % ointment Apply topically once as needed. 30 g 1   azelastine (ASTELIN) 0.1 % nasal spray PLACE 1-2 SPRAYS IN EACH NOSTRIL TWICE DAILY 30 mL 12   B Complex Vitamins (B-COMPLEX/B-12 PO) Take 1,000 mcg by mouth daily.     fexofenadine (ALLEGRA) 180 MG tablet Take 180 mg by mouth daily.     fluticasone (FLONASE) 50 MCG/ACT nasal spray Place 1 spray into both nostrils daily as needed for allergies or rhinitis.     esomeprazole (NEXIUM) 40 MG capsule Take 1 capsule (40 mg total) by mouth daily. 90 capsule 3   No current facility-administered medications for this visit.    Allergies-reviewed and updated No Known Allergies  Social History   Social History Narrative  Married. 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 Oropharynx normal Neck: no thyromegaly CV: RRR no murmurs rubs or gallops Lungs: CTAB no crackles, wheeze, rhonchi Abdomen: soft/nontender/nondistended/normal bowel sounds. No rebound or guarding.  Ext: no edema Skin: warm, dry Neuro: grossly normal, moves all extremities, PERRLA    Assessment and Plan:  36 y.o. male presenting for annual  physical.  Health Maintenance counseling: 1. Anticipatory guidance: Patient counseled regarding regular dental exams - needs to update q6 months, eye exams - yearly,  avoiding smoking and second hand smoke , limiting alcohol to 2 beverages per day-from 0-2 per week, no illicit drugs.   2. Risk factor reduction:  Advised patient of need for regular exercise and diet rich and fruits and vegetables to reduce risk of heart attack and stroke.  Exercise- 2-3 days a week - had to cut back due to his back- more body weiaght now plus walks 18 holes once a week.  Diet/weight management-continued gradual weight loss-Down 5 pounds from last physical in 2021.  Wt Readings from Last 3 Encounters:  12/28/21 193 lb (87.5 kg)  10/03/21 198 lb 4.8 oz (89.9 kg)  08/22/21 187 lb (84.8 kg)  3. Immunizations/screenings/ancillary studies-discussed if updated COVID-19 vaccination will wait until the fall- not strongly considering- worsened reflux, with the vaccination comes out.  Asked him to let us know the day of his fall flu shot  Immunization History  Administered Date(s) Administered   Influenza,inj,Quad PF,6+ Mos 03/18/2021   Influenza-Unspecified 03/15/2016, 03/14/2018   PFIZER(Purple Top)SARS-COV-2 Vaccination 06/22/2019, 07/10/2019   Tdap 01/31/2017   4. Prostate cancer screening- prior PSA from Dr.'s day labs-no family history of prostate cancer-consider regular screening at 72 Lab Results  Component Value Date   PSA 0.3 09/06/2017   5. Colon cancer screening - grandfather at age 52 but no early history-plan on starting screening at age 89 6. Skin cancer screening/prevention- seen 2 years ago for eyelid lesion- told to monitor. advised regular sunscreen use. Denies worrisome, changing, or new skin lesions.  7. Testicular cancer screening- advised monthly self exams  8. STD screening- patient opts out as monogamous. Planning on vasectomy after 2nd child 9. Smoking associated screening-never  smoker  Status of chronic or acute concerns   # GERD S:medication: In March we changed to Nexium 40 mg with mild poor control.  We discussed reducing the lowest effective dose- tried to go back to 20 mg but ineffective. Able to drink coffee 3 days a week and no issues -Could consider TIF in the past but symptoms had improved when he cut down on coffee significantly as well as alcohol and has wanted to hold off. He also did some reading and long term data  A/P: reasonable control- continue current meds- was unable to reduce dose. Check b12 with low normal in past- is taking otc b12 1000 mcg  #Vitamin D supplementation/high risk med use-  1000 units a day- will make sure not going to high  #Seasonal allergies-has done well with combo of Astelin and Flonase.  Did not like the taste of Astepro.  Also takes Allegra   #Left low back pain with radiculopathy-S1 per Dr. Berta Minor working with physical therapy through last month- back doing better but still some paresthesias- may have to go back to Dr. 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  Lab/Order associations: fasting   ICD-10-CM   1. Preventative health care  Z00.00 Vitamin B12    VITAMIN D 25 Hydroxy (Vit-D Deficiency, Fractures)    CBC with Differential/Platelet    Comprehensive metabolic panel    Lipid panel    2. High risk medication use  Z79.899 Vitamin B12    VITAMIN D 25 Hydroxy (Vit-D Deficiency, Fractures)    3. Gastroesophageal reflux disease without esophagitis  K21.9 CBC with Differential/Platelet    Comprehensive metabolic panel    4. Screening for hyperlipidemia  Z13.220 Lipid panel       Meds ordered this encounter  Medications   esomeprazole (NEXIUM) 40 MG capsule    Sig: Take 1 capsule (40 mg total) by mouth daily.    Dispense:  90 capsule    Refill:  3    Return precautions advised.   Garret Reddish, MD

## 2022-01-02 ENCOUNTER — Ambulatory Visit (INDEPENDENT_AMBULATORY_CARE_PROVIDER_SITE_OTHER): Payer: 59 | Admitting: Family Medicine

## 2022-01-02 VITALS — BP 138/86 | HR 65

## 2022-01-02 DIAGNOSIS — M5442 Lumbago with sciatica, left side: Secondary | ICD-10-CM | POA: Diagnosis not present

## 2022-01-02 DIAGNOSIS — G5622 Lesion of ulnar nerve, left upper limb: Secondary | ICD-10-CM | POA: Diagnosis not present

## 2022-01-02 NOTE — Progress Notes (Signed)
   I, Peterson Lombard, LAT, ATC acting as a scribe for Lynne Leader, MD.  Frank Young is a 36 y.o. male who presents to [place] at [facility] today for cont'd paresthesias to the lateral aspect of the L lower leg, but his LBP has resolved. 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 Pt was lastr seen by Dr. Georgina Snell on 10/03/21 for LBP. Prior, pt was seen on 04/13/21 for L cubital tunnel and was advised to use a cubital tunnel night split. Today, pt reports intermittent weakness w/ DF L foot.    Pertinent review of systems: No fevers or chills  Relevant historical information: GERD.  Cubital tunnel syndrome.  Anxiety.   Exam:  BP 138/86   Pulse 65   SpO2 98%  General: Well Developed, well nourished, and in no acute distress.   MSK: L-spine: Nontender midline. Normal lumbar motion. Lower extremity strength is intact. Reflexes are intact.    Lab and Radiology Results  EXAM: LUMBAR SPINE - 2-3 VIEW   COMPARISON:  X-ray 11/19/2019.   FINDINGS: There is no evidence of lumbar spine fracture. Alignment is normal. Minimal narrow intervertebral space at L5-S1 and in the lower thoracic spine noted.   IMPRESSION: Mild degenerative joint changes of spine as described.     Electronically Signed   By: Abelardo Diesel M.D.   On: 10/04/2021 09:37 I, Lynne Leader, personally (independently) visualized and performed the interpretation of the images attached in this note.     Assessment and Plan: 36 y.o. male with left lumbar radiculopathy at L5 or S1 dermatomal pattern.  Symptoms ongoing despite good trial of conservative management with physical therapy ongoing since May 2023.  Physical therapy did help back pain but he continues to experience radicular pain.  He notes a little bit of weakness to left foot dorsiflexion intermittently.  I was not able to appreciate weakness in clinic today but he certainly could be having it at  times.  Plan for MRI to further characterize potential radicular pain and for potential epidural steroid injection planning.   PDMP not reviewed this encounter. Orders Placed This Encounter  Procedures   MR Lumbar Spine Wo Contrast    Standing Status:   Future    Standing Expiration Date:   01/03/2023    Order Specific Question:   What is the patient's sedation requirement?    Answer:   No Sedation    Order Specific Question:   Does the patient have a pacemaker or implanted devices?    Answer:   No    Order Specific Question:   Preferred imaging location?    Answer:   [facility] (table limit-350lbs)   No orders of the defined types were placed in this encounter.    Discussed warning signs or symptoms. Please see discharge instructions. Patient expresses understanding.   The above documentation has been reviewed and is accurate and complete Lynne Leader, M.D.

## 2022-01-02 NOTE — Patient Instructions (Signed)
Thank you for coming in today.   You should hear from MRI scheduling within 1 week. If you do not hear please let me know.   Check back after MRI  

## 2022-01-08 ENCOUNTER — Encounter: Payer: Self-pay | Admitting: Family Medicine

## 2022-01-08 ENCOUNTER — Other Ambulatory Visit: Payer: Self-pay

## 2022-01-08 DIAGNOSIS — E875 Hyperkalemia: Secondary | ICD-10-CM

## 2022-01-14 ENCOUNTER — Other Ambulatory Visit: Payer: Self-pay

## 2022-01-15 ENCOUNTER — Other Ambulatory Visit: Payer: Self-pay

## 2022-01-16 ENCOUNTER — Encounter: Payer: Self-pay | Admitting: Family Medicine

## 2022-02-16 ENCOUNTER — Other Ambulatory Visit: Payer: Self-pay

## 2022-02-19 ENCOUNTER — Other Ambulatory Visit: Payer: Self-pay

## 2022-02-20 ENCOUNTER — Ambulatory Visit
Admission: RE | Admit: 2022-02-20 | Discharge: 2022-02-20 | Disposition: A | Discharge status: Home / Self Care | Payer: 59 | Source: Ambulatory Visit | Attending: Family Medicine | Admitting: Family Medicine

## 2022-02-20 DIAGNOSIS — M5442 Lumbago with sciatica, left side: Secondary | ICD-10-CM | POA: Diagnosis not present

## 2022-02-20 DIAGNOSIS — M5416 Radiculopathy, lumbar region: Secondary | ICD-10-CM | POA: Diagnosis not present

## 2022-02-23 ENCOUNTER — Telehealth: Payer: Self-pay | Admitting: Family Medicine

## 2022-02-23 ENCOUNTER — Encounter: Payer: Self-pay | Admitting: Family Medicine

## 2022-02-23 DIAGNOSIS — M5416 Radiculopathy, lumbar region: Secondary | ICD-10-CM

## 2022-02-23 NOTE — Progress Notes (Signed)
MRI shows potential pinched nerve at left L4 nerve root.  I have already ordered an epidural steroid injection.  You can call [facility] at [phone] to schedule the epidural.

## 2022-02-23 NOTE — Telephone Encounter (Signed)
Epidural steroid injection ordered 

## 2022-02-26 ENCOUNTER — Encounter: Payer: Self-pay | Admitting: *Deleted

## 2022-03-05 ENCOUNTER — Other Ambulatory Visit: Payer: Self-pay

## 2022-03-08 ENCOUNTER — Ambulatory Visit
Admission: RE | Admit: 2022-03-08 | Discharge: 2022-03-08 | Disposition: A | Discharge status: Home / Self Care | Payer: 59 | Source: Ambulatory Visit | Attending: Family Medicine | Admitting: Family Medicine

## 2022-03-08 DIAGNOSIS — M5116 Intervertebral disc disorders with radiculopathy, lumbar region: Secondary | ICD-10-CM | POA: Diagnosis not present

## 2022-03-08 DIAGNOSIS — M5416 Radiculopathy, lumbar region: Secondary | ICD-10-CM

## 2022-03-08 MED ORDER — IOPAMIDOL (ISOVUE-M 200) INJECTION 41%
1.0000 mL | Freq: Once | INTRAMUSCULAR | Status: AC
  Administered 2022-03-08: 1 mL via EPIDURAL

## 2022-03-08 MED ORDER — METHYLPREDNISOLONE ACETATE 40 MG/ML INJ SUSP (RADIOLOG
80.0000 mg | Freq: Once | INTRAMUSCULAR | Status: AC
  Administered 2022-03-08: 80 mg via EPIDURAL

## 2022-03-08 NOTE — Discharge Instructions (Signed)

## 2022-03-16 ENCOUNTER — Other Ambulatory Visit: Payer: Self-pay

## 2022-03-29 ENCOUNTER — Other Ambulatory Visit: Payer: Self-pay

## 2022-03-29 DIAGNOSIS — N35911 Unspecified urethral stricture, male, meatal: Secondary | ICD-10-CM | POA: Diagnosis not present

## 2022-03-29 DIAGNOSIS — L900 Lichen sclerosus et atrophicus: Secondary | ICD-10-CM | POA: Diagnosis not present

## 2022-03-29 DIAGNOSIS — N480 Leukoplakia of penis: Secondary | ICD-10-CM | POA: Diagnosis not present

## 2022-03-29 DIAGNOSIS — N35811 Other urethral stricture, male, meatal: Secondary | ICD-10-CM | POA: Diagnosis not present

## 2022-03-29 DIAGNOSIS — N471 Phimosis: Secondary | ICD-10-CM | POA: Diagnosis not present

## 2022-03-29 DIAGNOSIS — Z9889 Other specified postprocedural states: Secondary | ICD-10-CM | POA: Diagnosis not present

## 2022-03-29 MED ORDER — URIBEL 118 MG PO CAPS
ORAL_CAPSULE | ORAL | 0 refills | Status: DC
  Filled 2022-03-29: qty 21, 7d supply, fill #0

## 2022-03-30 ENCOUNTER — Other Ambulatory Visit: Payer: Self-pay

## 2022-04-15 ENCOUNTER — Telehealth: Payer: 59 | Admitting: Emergency Medicine

## 2022-04-15 DIAGNOSIS — J069 Acute upper respiratory infection, unspecified: Secondary | ICD-10-CM

## 2022-04-15 MED ORDER — PROMETHAZINE-DM 6.25-15 MG/5ML PO SYRP: 5.0000 mL | ORAL_SOLUTION | Freq: Four times a day (QID) | ORAL | 0 refills | Status: DC | PRN

## 2022-04-15 NOTE — Progress Notes (Signed)
We are sorry that you are not feeling well.  Here is how we plan to help!  Based on your presentation I believe you most likely have A cough due to a virus.  This is best treated by rest, plenty of fluids and control of the cough.  You may use Ibuprofen or Tylenol as directed to help your symptoms.     In addition I have prescribed phenergan DM cough syrup. By law, I am not able to prescribe controlled substances like cough syrup with hydrocodone through Evisits.    From your responses in the eVisit questionnaire you describe inflammation in the upper respiratory tract which is causing a significant cough.  This is commonly called Bronchitis and has four common causes:   Allergies Viral Infections Acid Reflux Bacterial Infection Allergies, viruses and acid reflux are treated by controlling symptoms or eliminating the cause. An example might be a cough caused by taking certain blood pressure medications. You stop the cough by changing the medication. Another example might be a cough caused by acid reflux. Controlling the reflux helps control the cough.  USE OF BRONCHODILATOR ("RESCUE") INHALERS: There is a risk from using your bronchodilator too frequently.  The risk is that over-reliance on a medication which only relaxes the muscles surrounding the breathing tubes can reduce the effectiveness of medications prescribed to reduce swelling and congestion of the tubes themselves.  Although you feel brief relief from the bronchodilator inhaler, your asthma may actually be worsening with the tubes becoming more swollen and filled with mucus.  This can delay other crucial treatments, such as oral steroid medications. If you need to use a bronchodilator inhaler daily, several times per day, you should discuss this with your provider.  There are probably better treatments that could be used to keep your asthma under control.     HOME CARE Only take medications as instructed by your medical team. Complete  the entire course of an antibiotic. Drink plenty of fluids and get plenty of rest. Avoid close contacts especially the very young and the elderly Cover your mouth if you cough or cough into your sleeve. Always remember to wash your hands A steam or ultrasonic humidifier can help congestion.   GET HELP RIGHT AWAY IF: You develop worsening fever. You become short of breath You cough up blood. Your symptoms persist after you have completed your treatment plan MAKE SURE YOU  Understand these instructions. Will watch your condition. Will get help right away if you are not doing well or get worse.    Thank you for choosing an e-visit.  Your e-visit answers were reviewed by a board certified advanced clinical practitioner to complete your personal care plan. Depending upon the condition, your plan could have included both over the counter or prescription medications.  Please review your pharmacy choice. Make sure the pharmacy is open so you can pick up prescription now. If there is a problem, you may contact your provider through [employer] and have the prescription routed to another pharmacy.  Your safety is important to [place]. If you have drug allergies check your prescription carefully.   For the next 24 hours you can use MyChart to ask questions about today's visit, request a non-urgent call back, or ask for a work or school excuse. You will get an email in the next two days asking about your experience. I hope that your e-visit has been valuable and will speed your recovery.  I have spent 5 minutes in review of  e-visit questionnaire, review and updating patient chart, medical decision making and response to patient.   Carvel Getting, NP

## 2022-04-20 ENCOUNTER — Other Ambulatory Visit: Payer: Self-pay

## 2022-04-30 DIAGNOSIS — N35911 Unspecified urethral stricture, male, meatal: Secondary | ICD-10-CM | POA: Diagnosis not present

## 2022-05-03 ENCOUNTER — Encounter: Payer: Self-pay | Admitting: Family Medicine

## 2022-05-03 DIAGNOSIS — M5416 Radiculopathy, lumbar region: Secondary | ICD-10-CM

## 2022-05-07 ENCOUNTER — Other Ambulatory Visit: Payer: Self-pay

## 2022-05-09 ENCOUNTER — Other Ambulatory Visit: Payer: Self-pay

## 2022-05-10 ENCOUNTER — Ambulatory Visit: Payer: 59 | Admitting: Family Medicine

## 2022-05-10 ENCOUNTER — Encounter: Payer: Self-pay | Admitting: Family Medicine

## 2022-05-10 ENCOUNTER — Other Ambulatory Visit: Payer: Self-pay

## 2022-05-10 VITALS — BP 138/80 | HR 88 | Temp 97.6°F | Ht 75.0 in | Wt 195.0 lb

## 2022-05-10 DIAGNOSIS — M270 Developmental disorders of jaws: Secondary | ICD-10-CM

## 2022-05-10 DIAGNOSIS — K219 Gastro-esophageal reflux disease without esophagitis: Secondary | ICD-10-CM

## 2022-05-10 MED ORDER — RABEPRAZOLE SODIUM 20 MG PO TBEC
20.0000 mg | DELAYED_RELEASE_TABLET | Freq: Every day | ORAL | 0 refills | Status: DC
  Filled 2022-05-10: qty 30, 30d supply, fill #0

## 2022-05-10 NOTE — Patient Instructions (Addendum)
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

## 2022-05-10 NOTE — Progress Notes (Signed)
Phone [phone] In person visit   Subjective:   Frank Young is a 36 y.o. year old very pleasant male patient who presents for/with See problem oriented charting Chief Complaint  Patient presents with   Mass    Pt states he has a bump on the bone in the back part of his mouth, pt does not have an appointment with the dentist til Jan     Gastroesophageal Reflux    Pt also wants to talk about his acid reflux     Past Medical History-  Patient Active Problem List   Diagnosis Date Noted   Anxiety     Priority: Medium    GERD (gastroesophageal reflux disease)     Priority: Medium    Chronic tension headaches     Priority: Medium    Acute left-sided low back pain with left-sided sciatica 10/04/2021    Priority: Low   Cubital tunnel syndrome, left 04/13/2021    Priority: Low   Allergic rhinitis     Priority: Low    Medications- reviewed and updated Current Outpatient Medications  Medication Sig Dispense Refill   augmented betamethasone dipropionate (DIPROLENE-AF) 0.05 % ointment Apply topically once as needed. 30 g 1   augmented betamethasone dipropionate (DIPROLENE-AF) 0.05 % ointment Apply topically.     azelastine (ASTELIN) 0.1 % nasal spray PLACE 1-2 SPRAYS IN EACH NOSTRIL TWICE DAILY 30 mL 12   azelastine (ASTELIN) 0.1 % nasal spray 1 spray into each nostril Two (2) times a day.     B Complex Vitamins (B-COMPLEX/B-12 PO) Take 1,000 mcg by mouth daily.     esomeprazole (NEXIUM) 40 MG capsule Take 1 capsule (40 mg total) by mouth daily. 90 capsule 3   fexofenadine (ALLEGRA) 180 MG tablet Take 180 mg by mouth daily.     fluticasone (FLONASE) 50 MCG/ACT nasal spray Place 1 spray into both nostrils daily as needed for allergies or rhinitis.     Meth-Hyo-M Bl-Na Phos-Ph Sal (URIBEL) 118 MG CAPS Take 1 capsule by mouth Three (3) times a day as needed (burning with urination). 21 capsule 0   RABEprazole (ACIPHEX) 20 MG tablet Take 1 tablet (20 mg total) by mouth daily. 1  month in place of nexium- update [place] on progress 30 tablet 0   No current facility-administered medications for this visit.     Objective:  BP 138/80 (BP Location: Left Arm, Cuff Size: Normal)   Pulse 88   Temp 97.6 F (36.4 C)   Ht '6\' 3"'$  (1.905 m)   Wt 195 lb (88.5 kg)   SpO2 98%   BMI 24.37 kg/m  Gen: NAD, resting comfortably In right lower mouth on anterior portion of mandible bony overgrowth noted on the right compared to the left    Assessment and Plan   # Mass in mouth S: Patient has noted a protrusion in the back part of his mouth starting week of thanksgiving- tongue rubbed over it- had never noted before.  Was able to schedule dental appointment but not until January. 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 Worse recently with unclear trigger. Still low on tomato based products and has cut out caffeine in last few weeks. Typical of his symptoms- light burning  from epigastric area up into chest, occasional gurgling in throat. No exertional symptoms- hour workout last week and no issues.  -minimal nsaids- 1 dose a few days a few weeks ago. Worse on Sunday with beer- which he would expect -endoscopy 2021 -no significant weight gain  B12 levels related to PPI use: Lab Results  Component Value Date   VITAMINB12 987 (H) 12/28/2021  A/P: Reflux poorly controlled - Check for H. pylori antibodies-was negative in 2021 so if positive this could be a cause of worsening.  H. pylori breath test would not be as helpful currently on PPI -We will try to get Aciphex or Dexilant covered at least short-term for a month and if not improving likely follow-up with GI.  If not covered try Nexium twice  daily  Recommended follow up: Return for next already scheduled visit or sooner if needed. Future Appointments  Date Time Provider [place]  01/03/2023  9:20 AM Marin Olp, MD [facility]   Lab/Order associations:   ICD-10-CM   1. 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 1 month in place of nexium- update [place] on progress    Dispense:  30 tablet    Refill:  0    Return precautions advised.  Garret Reddish, MD

## 2022-05-16 DIAGNOSIS — R55 Syncope and collapse: Secondary | ICD-10-CM | POA: Diagnosis not present

## 2022-05-16 DIAGNOSIS — R1084 Generalized abdominal pain: Secondary | ICD-10-CM | POA: Diagnosis not present

## 2022-05-16 DIAGNOSIS — Z1152 Encounter for screening for COVID-19: Secondary | ICD-10-CM | POA: Diagnosis not present

## 2022-05-16 DIAGNOSIS — R161 Splenomegaly, not elsewhere classified: Secondary | ICD-10-CM | POA: Diagnosis not present

## 2022-05-16 DIAGNOSIS — I4510 Unspecified right bundle-branch block: Secondary | ICD-10-CM | POA: Diagnosis not present

## 2022-05-16 DIAGNOSIS — K219 Gastro-esophageal reflux disease without esophagitis: Secondary | ICD-10-CM | POA: Diagnosis not present

## 2022-05-16 DIAGNOSIS — R000 Tachycardia, unspecified: Secondary | ICD-10-CM | POA: Diagnosis not present

## 2022-05-16 DIAGNOSIS — R112 Nausea with vomiting, unspecified: Secondary | ICD-10-CM | POA: Diagnosis not present

## 2022-05-16 DIAGNOSIS — Z20822 Contact with and (suspected) exposure to covid-19: Secondary | ICD-10-CM | POA: Diagnosis not present

## 2022-05-16 DIAGNOSIS — R197 Diarrhea, unspecified: Secondary | ICD-10-CM | POA: Diagnosis not present

## 2022-05-16 DIAGNOSIS — R188 Other ascites: Secondary | ICD-10-CM | POA: Diagnosis not present

## 2022-05-16 DIAGNOSIS — R9431 Abnormal electrocardiogram [ECG] [EKG]: Secondary | ICD-10-CM | POA: Diagnosis not present

## 2022-05-16 DIAGNOSIS — R109 Unspecified abdominal pain: Secondary | ICD-10-CM | POA: Diagnosis not present

## 2022-05-17 ENCOUNTER — Other Ambulatory Visit: Payer: Self-pay

## 2022-05-17 ENCOUNTER — Ambulatory Visit: Payer: 59 | Admitting: Family Medicine

## 2022-05-17 ENCOUNTER — Encounter: Payer: Self-pay | Admitting: Family Medicine

## 2022-05-17 VITALS — BP 100/68 | HR 80 | Temp 98.2°F | Ht 75.0 in | Wt 199.4 lb

## 2022-05-17 DIAGNOSIS — E875 Hyperkalemia: Secondary | ICD-10-CM | POA: Diagnosis not present

## 2022-05-17 DIAGNOSIS — A084 Viral intestinal infection, unspecified: Secondary | ICD-10-CM

## 2022-05-17 DIAGNOSIS — K219 Gastro-esophageal reflux disease without esophagitis: Secondary | ICD-10-CM

## 2022-05-17 DIAGNOSIS — R112 Nausea with vomiting, unspecified: Secondary | ICD-10-CM | POA: Diagnosis not present

## 2022-05-17 LAB — CBC WITH DIFFERENTIAL/PLATELET
Basophils Absolute: 0 10*3/uL (ref 0.0–0.1)
Basophils Relative: 0.3 % (ref 0.0–3.0)
Eosinophils Absolute: 0 10*3/uL (ref 0.0–0.7)
Eosinophils Relative: 0.6 % (ref 0.0–5.0)
HCT: 42.3 % (ref 39.0–52.0)
Hemoglobin: 14.5 g/dL (ref 13.0–17.0)
Lymphocytes Relative: 19 % (ref 12.0–46.0)
Lymphs Abs: 1.1 10*3/uL (ref 0.7–4.0)
MCHC: 34.3 g/dL (ref 30.0–36.0)
MCV: 92 fl (ref 78.0–100.0)
Monocytes Absolute: 0.7 10*3/uL (ref 0.1–1.0)
Monocytes Relative: 12.4 % — ABNORMAL HIGH (ref 3.0–12.0)
Neutro Abs: 3.8 10*3/uL (ref 1.4–7.7)
Neutrophils Relative %: 67.7 % (ref 43.0–77.0)
Platelets: 266 10*3/uL (ref 150.0–400.0)
RBC: 4.59 Mil/uL (ref 4.22–5.81)
RDW: 13.9 % (ref 11.5–15.5)
WBC: 5.7 10*3/uL (ref 4.0–10.5)

## 2022-05-17 LAB — COMPREHENSIVE METABOLIC PANEL
ALT: 15 U/L (ref 0–53)
AST: 16 U/L (ref 0–37)
Albumin: 4.1 g/dL (ref 3.5–5.2)
Alkaline Phosphatase: 48 U/L (ref 39–117)
BUN: 7 mg/dL (ref 6–23)
CO2: 30 mEq/L (ref 19–32)
Calcium: 8.7 mg/dL (ref 8.4–10.5)
Chloride: 105 mEq/L (ref 96–112)
Creatinine, Ser: 0.95 mg/dL (ref 0.40–1.50)
GFR: 101.8 mL/min (ref >60.00)
Glucose, Bld: 91 mg/dL (ref 70–99)
Potassium: 3.6 mEq/L (ref 3.5–5.1)
Sodium: 141 mEq/L (ref 135–145)
Total Bilirubin: 0.8 mg/dL (ref 0.2–1.2)
Total Protein: 6.3 g/dL (ref 6.0–8.3)

## 2022-05-17 MED ORDER — ONDANSETRON 4 MG PO TBDP
ORAL_TABLET | ORAL | 0 refills | Status: DC
  Filled 2022-05-17: qty 12, 7d supply, fill #0

## 2022-05-17 NOTE — Patient Instructions (Addendum)
Please stop by lab before you go If you have mychart- we will send your results within 3 business days of [place] receiving them.  If you do not have mychart- we will call you about results within 5 business days of [place] receiving them.  *please also note that you will see labs on mychart as soon as they post. I will later go in and write notes on them- will say "notes from Dr. Yong Channel"   Glad you are feeling better but please stay in touch if any worsening symptoms. Keep pushing fluids and advance food as tolerated.   Recommended follow up: Return for as needed for new, worsening, persistent symptoms.

## 2022-05-17 NOTE — Progress Notes (Signed)
Phone [phone] In person visit   Subjective:   Frank Young is a 36 y.o. year old very pleasant male patient who presents for/with See problem oriented charting Chief Complaint  Patient presents with   Follow-up    Pt states he is feeling a lot better.   Past Medical History-  Patient Active Problem List   Diagnosis Date Noted   Anxiety     Priority: Medium    GERD (gastroesophageal reflux disease)     Priority: Medium    Chronic tension headaches     Priority: Medium    Acute left-sided low back pain with left-sided sciatica 10/04/2021    Priority: Low   Cubital tunnel syndrome, left 04/13/2021    Priority: Low   Allergic rhinitis     Priority: Low    Medications- reviewed and updated Current Outpatient Medications  Medication Sig Dispense Refill   augmented betamethasone dipropionate (DIPROLENE-AF) 0.05 % ointment Apply topically once as needed. 30 g 1   augmented betamethasone dipropionate (DIPROLENE-AF) 0.05 % ointment Apply topically.     azelastine (ASTELIN) 0.1 % nasal spray PLACE 1-2 SPRAYS IN EACH NOSTRIL TWICE DAILY 30 mL 12   azelastine (ASTELIN) 0.1 % nasal spray 1 spray into each nostril Two (2) times a day.     B Complex Vitamins (B-COMPLEX/B-12 PO) Take 1,000 mcg by mouth daily.     esomeprazole (NEXIUM) 40 MG capsule Take 1 capsule (40 mg total) by mouth daily. 90 capsule 3   fexofenadine (ALLEGRA) 180 MG tablet Take 180 mg by mouth daily.     fluticasone (FLONASE) 50 MCG/ACT nasal spray Place 1 spray into both nostrils daily as needed for allergies or rhinitis.     Meth-Hyo-M Bl-Na Phos-Ph Sal (URIBEL) 118 MG CAPS Take 1 capsule by mouth Three (3) times a day as needed (burning with urination). 21 capsule 0   ondansetron (ZOFRAN-ODT) 4 MG disintegrating tablet Take 1 tablet (4 mg total) by mouth every eight (8) hours as needed for nausea for up to 7 days. 12 tablet 0   No current facility-administered medications for this visit.     Objective:   BP 100/68   Pulse 80   Temp 98.2 F (36.8 C)   Ht '6\' 3"'$  (1.905 m)   Wt 199 lb 6.4 oz (90.4 kg)   SpO2 97%   BMI 24.92 kg/m  Gen: NAD, resting comfortably CV: RRR no murmurs rubs or gallops Lungs: CTAB no crackles, wheeze, rhonchi Abdomen: soft/nontender/nondistended/normal bowel sounds. No rebound or guarding.  Ext: no edema Skin: warm, dry    Assessment and Plan   # ED follow-up for potential gastroenteritis S: Patient was seen yesterday at [facility] emergency department after developing nausea, nonbloody nonbilious vomiting, nonbloody diarrhea and chills with sudden onset with waking up that morning.  Also described progressive lightheadedness throughout the day and had a presyncopal episode after vomiting and diarrhea episode.  Had tingling sensation in extremities and genitals.  Some abdominal discomfort with bowel movements but no persistent abdominal pain.  No known immunocompromising condition.  No recent antibiotic use. Flu negative, covid negative, rsv negative -felt presyncopal even laying on floor with legs up.  -Had elevated lactate but improved with hydration. Was given 3L of fluid.  -Had CT scan of the abdomen and concern for potential early appendicitis as well as some trace nonspecific fluid in the pelvis and mild stranding of the urinary bladder-possible cystitis.  He did have some bilateral lower abdominal pain on exam  -  Was given Zofran for nausea and encouraged to hydrate aggressively with fluids like Pedialyte -Blood cultures were ordered-negative to date . Did receive dose of vancomycin and cefepime.  -wbc 14k including left shift -has not needed zofran- did not pick up  Has not recently seen patients with GI illness and kids have not been ill other than some congestion. Wonders if could have been something he ate- perhaps guac or [place] mikes but cooked [place] cheese steak.   Today reports no more vomiting since before going to ER and last diarrhea episode  this morning but much lighter. 10-15 episodes of diarrhea. No abdominal pain today- was mainly only with bowel movements per his report. Dizziness has resolved today. Has done some apple juice mixed with water and regular water. Has eaten some- toast and snack mix.  A/P: Interesting case- possible viral gastroenteritis vs. Food poisoning- doing much better clinically and benign abdomen today- there was some concern for early appendicitis but does not appear to be developing further. With lab abnormalities update CBC and CMP  -no UTI symptoms- hold off on culture and rest of UA other than ketones normal.  -see after visit summary  # GERD S:Medication: aciphex 20 mg did not work- flare up in symptoms compared to nexium 40 mg- he changed right back appropriately so.   A/P: reflux well controlled back on nexium 40 mg -still get H Pylori today since still slightly worse than baseline   Recommended follow up: Return for as needed for new, worsening, persistent symptoms. Future Appointments  Date Time Provider [place]  01/03/2023  9:20 AM Marin Olp, MD [facility]    Lab/Order associations:   ICD-10-CM   1. Viral gastroenteritis  A08.4 CBC with Differential/Platelet    2. Gastroesophageal reflux disease without esophagitis  P71.0 Helicobacter pylori abs-IgG+IgA, bld    3. Hyperkalemia  E87.5 Comprehensive metabolic panel     Return precautions advised.  Garret Reddish, MD

## 2022-05-17 NOTE — Assessment & Plan Note (Signed)
S:Medication: aciphex 20 mg did not work- flare up in symptoms compared to nexium 40 mg- he changed right back appropriately so.   A/P: reflux well controlled back on nexium 40 mg -still get H Pylori today since still slightly worse than baseline

## 2022-05-24 LAB — SPECIMEN STATUS REPORT

## 2022-05-25 ENCOUNTER — Encounter: Payer: Self-pay | Admitting: Family Medicine

## 2022-05-25 DIAGNOSIS — K219 Gastro-esophageal reflux disease without esophagitis: Secondary | ICD-10-CM

## 2022-05-29 ENCOUNTER — Encounter: Payer: Self-pay | Admitting: Pharmacist

## 2022-05-29 ENCOUNTER — Other Ambulatory Visit: Payer: Self-pay

## 2022-05-29 MED ORDER — DEXLANSOPRAZOLE 60 MG PO CPDR
60.0000 mg | DELAYED_RELEASE_CAPSULE | Freq: Every day | ORAL | 5 refills | Status: DC
  Filled 2022-05-29 – 2022-08-20 (×2): qty 30, 30d supply, fill #0

## 2022-06-12 ENCOUNTER — Other Ambulatory Visit: Payer: Self-pay

## 2022-06-12 ENCOUNTER — Other Ambulatory Visit: Payer: Self-pay | Admitting: Family Medicine

## 2022-06-12 MED ORDER — AZELASTINE HCL 0.1 % NA SOLN
1.0000 | Freq: Two times a day (BID) | NASAL | 5 refills | Status: DC
  Filled 2022-06-12: qty 30, 30d supply, fill #0
  Filled 2022-07-13: qty 30, 30d supply, fill #1
  Filled 2022-08-17: qty 30, 30d supply, fill #2
  Filled 2022-09-17: qty 30, 30d supply, fill #3
  Filled 2022-10-14: qty 30, 30d supply, fill #4
  Filled 2022-11-15: qty 30, 30d supply, fill #5

## 2022-06-14 ENCOUNTER — Other Ambulatory Visit: Payer: Self-pay

## 2022-06-14 DIAGNOSIS — Z3009 Encounter for other general counseling and advice on contraception: Secondary | ICD-10-CM | POA: Diagnosis not present

## 2022-06-14 MED ORDER — DIAZEPAM 10 MG PO TABS
10.0000 mg | ORAL_TABLET | Freq: Once | ORAL | 0 refills | Status: AC
  Filled 2022-06-14: qty 1, 1d supply, fill #0

## 2022-06-14 MED ORDER — OXYCODONE HCL 5 MG PO TABS
ORAL_TABLET | ORAL | 0 refills | Status: DC
  Filled 2022-06-14: qty 6, 1d supply, fill #0

## 2022-07-13 ENCOUNTER — Other Ambulatory Visit: Payer: Self-pay

## 2022-07-24 ENCOUNTER — Other Ambulatory Visit: Payer: Self-pay

## 2022-07-24 ENCOUNTER — Encounter: Payer: Self-pay | Admitting: Pharmacist

## 2022-07-24 DIAGNOSIS — R1319 Other dysphagia: Secondary | ICD-10-CM | POA: Diagnosis not present

## 2022-07-24 DIAGNOSIS — K219 Gastro-esophageal reflux disease without esophagitis: Secondary | ICD-10-CM | POA: Diagnosis not present

## 2022-07-24 MED ORDER — DEXLANSOPRAZOLE 30 MG PO CPDR
30.0000 mg | DELAYED_RELEASE_CAPSULE | Freq: Every day | ORAL | 11 refills | Status: DC
  Filled 2022-07-24 – 2022-09-17 (×3): qty 30, 30d supply, fill #0
  Filled 2022-10-18: qty 30, 30d supply, fill #1
  Filled 2022-11-21: qty 30, 30d supply, fill #2
  Filled 2022-12-13 – 2022-12-17 (×2): qty 30, 30d supply, fill #3
  Filled 2023-01-15: qty 30, 30d supply, fill #4
  Filled 2023-02-15: qty 30, 30d supply, fill #5
  Filled 2023-03-12: qty 30, 30d supply, fill #6
  Filled 2023-04-17: qty 30, 30d supply, fill #7

## 2022-07-25 ENCOUNTER — Other Ambulatory Visit: Payer: Self-pay | Admitting: Gastroenterology

## 2022-07-25 DIAGNOSIS — K219 Gastro-esophageal reflux disease without esophagitis: Secondary | ICD-10-CM

## 2022-07-25 DIAGNOSIS — R1319 Other dysphagia: Secondary | ICD-10-CM

## 2022-08-07 ENCOUNTER — Other Ambulatory Visit: Payer: Self-pay

## 2022-08-09 ENCOUNTER — Ambulatory Visit
Admission: RE | Admit: 2022-08-09 | Discharge: 2022-08-09 | Disposition: A | Discharge status: Home / Self Care | Payer: 59 | Source: Ambulatory Visit | Attending: Gastroenterology | Admitting: Gastroenterology

## 2022-08-09 DIAGNOSIS — K219 Gastro-esophageal reflux disease without esophagitis: Secondary | ICD-10-CM | POA: Insufficient documentation

## 2022-08-09 DIAGNOSIS — R1319 Other dysphagia: Secondary | ICD-10-CM | POA: Insufficient documentation

## 2022-08-10 DIAGNOSIS — N35811 Other urethral stricture, male, meatal: Secondary | ICD-10-CM | POA: Diagnosis not present

## 2022-08-10 DIAGNOSIS — N35911 Unspecified urethral stricture, male, meatal: Secondary | ICD-10-CM | POA: Diagnosis not present

## 2022-08-17 ENCOUNTER — Other Ambulatory Visit: Payer: Self-pay

## 2022-08-20 ENCOUNTER — Other Ambulatory Visit: Payer: Self-pay

## 2022-08-20 MED ORDER — BETAMETHASONE DIPROPIONATE AUG 0.05 % EX OINT
TOPICAL_OINTMENT | CUTANEOUS | 1 refills | Status: AC
  Filled 2022-08-20: qty 30, 15d supply, fill #0
  Filled 2022-09-17: qty 50, 30d supply, fill #0

## 2022-08-21 ENCOUNTER — Other Ambulatory Visit: Payer: Self-pay

## 2022-09-11 ENCOUNTER — Other Ambulatory Visit: Payer: Self-pay

## 2022-09-12 NOTE — Progress Notes (Unsigned)
Frank Payor, PhD, LAT, ATC acting as a scribe for Frank Graham, MD.  Frank Young is a 37 y.o. male who presents to [facility] at [facility] today for re-occurring radicular symptoms into his L leg. Pt was last seen by Dr. [name] on 01/02/22 and was advised to proceed to L-spine MRI. Based on MRI findings, a lumbar ESI was ordered and later performed on 03/08/22. Pt contacted the office on 11/30 requesting a repeat ESI, order was placed, but never performed.  Today, pt reports worsening radicular symptoms over the last 2-3 wks. Pt c/o numbness along the lateral L foot, ankle, and lateral lower leg. Pt never got the last ESI due to resolved symptoms and then he was ill. Pt has started golf more.   Pt also c/o L knee pain ongoing since Dec. He notes mechanical symptoms, but doesn't have any location of pain. No injury.  When he squats down he notes a clunk in his left knee.  L Knee swelling: no Mechanical symptoms: yes Aggravates: deep squats Treatments tried: none    Dx imaging: 02/20/22 L-spine MRI  10/03/21 L-spine XR  11/19/19 L-spine & T-spine XR  Pertinent review of systems: No fevers or chills  Relevant historical information: Anxiety and tension headaches.   Exam:  BP 128/82   Pulse 65   Ht 6\' 3"  (1.905 m)   Wt 198 lb (89.8 kg)   SpO2 98%   BMI 24.75 kg/m  General: Well Developed, well nourished, and in no acute distress.   MSK: L-spine: Normal lumbar motion.  Lower extremity strength is intact.  Left knee: Normal-appearing Nontender. Normal motion. Stable ligamentous exam. Positive McMurray's test. Intact strength. Patient squats from a standing position he has an audible clunk in his left knee.    Lab and Radiology Results  Diagnostic Limited MSK Ultrasound of: Left knee Quad tendon intact normal. Patellar tendon intact normal. Medial joint line calcific change at proximal MCL area visible on ultrasound.  This corresponds to  structure seen on x-ray as well. Lateral joint line normal. Posterior knee no Baker's cyst. 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  Appears chronic.  No significant degenerative changes.  No acute fractures are visible. Await formal radiology review   Assessment and Plan: 37 y.o. male with lumbar radiculopathy primarily affecting the left leg.  This is a recurrent problem.  Plan for epidural steroid injection now.  Continue activity as tolerated.  Left knee mechanical clunking.  He does have this what appears to be chronic calcific loose body or accessory ossicle at the medial femoral condyle.  I do not think it is causing his mechanical symptoms.  He does not think so either as he feels it in the more posterior aspect of his knee.  He likely does have a meniscus injury causing his symptoms.  His symptoms are so intermittent and mild at this time that he would like to proceed with some watchful waiting.  If needed MRI in the future will be helpful.  May be reasonable to consider steroid injection as well.   PDMP not reviewed this encounter. Orders Placed This Encounter  Procedures   [place] LIMITED JOINT SPACE STRUCTURES LOW LEFT(NO LINKED CHARGES)    Order Specific Question:   Reason for Exam (SYMPTOM  OR DIAGNOSIS REQUIRED)    Answer:   left  knee pain    Order Specific Question:   Preferred imaging location?    Answer:   [facility] Knee AP/LAT W/Sun[place] Left    Standing Status:   Future    Number of Occurrences:   1    Standing Expiration Date:   09/13/2023    Order Specific Question:   Reason for Exam (SYMPTOM  OR DIAGNOSIS REQUIRED)    Answer:   eval knee pain    Order Specific Question:   Preferred imaging location?    Answer:   [facility]   DG INJECT DIAG/THERA/INC  NEEDLE/CATH/PLC EPI/LUMB/SAC W/IMG    Standing Status:   Future    Standing Expiration Date:   09/13/2023    Order Specific Question:   Reason for Exam (SYMPTOM  OR DIAGNOSIS REQUIRED)    Answer:   Repeat ESI or NRB or both. Level and techniqe per radiology    Order Specific Question:   Preferred Imaging Location?    Answer:   [facility]    Order Specific Question:   Radiology Contrast Protocol - do NOT remove file path    Answer:   \\[email or web address]   No orders of the defined types were placed in this encounter.    Discussed warning signs or symptoms. Please see discharge instructions. Patient expresses understanding.   The above documentation has been reviewed and is accurate and complete Frank Young, M.D.

## 2022-09-13 ENCOUNTER — Ambulatory Visit (INDEPENDENT_AMBULATORY_CARE_PROVIDER_SITE_OTHER): Payer: 59 | Admitting: Family Medicine

## 2022-09-13 ENCOUNTER — Ambulatory Visit (INDEPENDENT_AMBULATORY_CARE_PROVIDER_SITE_OTHER): Payer: 59

## 2022-09-13 ENCOUNTER — Other Ambulatory Visit: Payer: Self-pay

## 2022-09-13 VITALS — BP 128/82 | HR 65 | Ht 75.0 in | Wt 198.0 lb

## 2022-09-13 DIAGNOSIS — M25562 Pain in left knee: Secondary | ICD-10-CM | POA: Diagnosis not present

## 2022-09-13 DIAGNOSIS — M5416 Radiculopathy, lumbar region: Secondary | ICD-10-CM

## 2022-09-13 DIAGNOSIS — G8929 Other chronic pain: Secondary | ICD-10-CM | POA: Diagnosis not present

## 2022-09-13 NOTE — Patient Instructions (Signed)
Thank you for coming in today.   Please get an Xray today before you leave   Please call [facility] at [phone] to schedule your spine injection.    Let me know about that knee.

## 2022-09-14 NOTE — Progress Notes (Signed)
Left knee x-ray looks normal to radiology

## 2022-09-17 ENCOUNTER — Other Ambulatory Visit: Payer: Self-pay

## 2022-09-18 ENCOUNTER — Other Ambulatory Visit: Payer: Self-pay

## 2022-09-18 ENCOUNTER — Other Ambulatory Visit: Payer: Self-pay | Admitting: Family Medicine

## 2022-09-18 DIAGNOSIS — M5416 Radiculopathy, lumbar region: Secondary | ICD-10-CM

## 2022-09-25 ENCOUNTER — Other Ambulatory Visit: Payer: Self-pay

## 2022-09-25 ENCOUNTER — Ambulatory Visit (INDEPENDENT_AMBULATORY_CARE_PROVIDER_SITE_OTHER): Payer: 59 | Admitting: Family Medicine

## 2022-09-25 VITALS — BP 142/86 | HR 73 | Ht 75.0 in | Wt 197.0 lb

## 2022-09-25 DIAGNOSIS — G8929 Other chronic pain: Secondary | ICD-10-CM

## 2022-09-25 DIAGNOSIS — M25522 Pain in left elbow: Secondary | ICD-10-CM

## 2022-09-25 DIAGNOSIS — M25562 Pain in left knee: Secondary | ICD-10-CM | POA: Diagnosis not present

## 2022-09-25 NOTE — Progress Notes (Unsigned)
   Rubin Payor, PhD, LAT, ATC acting as a scribe for [name], MD.  Frank Young is a 37 y.o. male who presents to [facility] at [facility] today for worsening L knee pain. Pt was last seen by Dr. [name] on 09/13/22 for lumbar radiculopathy and L knee pain.  Today, pt reports L knee pain worsened over the weekend. Pt was walking and made a rotational movement and felt a sharp pain in his L knee. Pt locates pain to the medial joint line  L knee swelling: yes- feels "tight" Mechanical symptoms: yes Aggravates: rotational motions, walking, knee flexion  Dx imaging: 09/13/22 L knee XR  Pertinent review of systems: No fevers or chills  Relevant historical information: GERD.   Exam:  BP (!) 142/86   Pulse 73   Ht  (1.905 m)   Wt 197 lb (89.4 kg)   SpO2 97%   BMI 24.62 kg/m  General: Well Developed, well nourished, and in no acute distress.   MSK: Left knee mild effusion.  Normal-appearing otherwise. Normal motion. Tender palpation medial and lateral joint line. Positive McMurray's test. Intact strength.    Lab and Radiology Results No results found for this or any previous visit (from the past 72 hour(s)). No results found.     Assessment and Plan: 36 y.o. male with ***   PDMP not reviewed this encounter. Orders Placed This Encounter  Procedures   [place] LIMITED JOINT SPACE STRUCTURES LOW LEFT(NO LINKED CHARGES)    Order Specific Question:   Reason for Exam (SYMPTOM  OR DIAGNOSIS REQUIRED)    Answer:   left knee pain    Order Specific Question:   Preferred imaging location?    Answer:   [facility]   MR Knee Left  Wo Contrast    Standing Status:   Future    Standing Expiration Date:   09/25/2023    Order Specific Question:   What is the patient's sedation requirement?    Answer:   No Sedation    Order Specific Question:   Does the patient have a pacemaker or implanted devices?    Answer:   No    Order  Specific Question:   Preferred imaging location?    Answer:   [facility] (table limit-550lbs)   No orders of the defined types were placed in this encounter.    Discussed warning signs or symptoms. Please see discharge instructions. Patient expresses understanding.   ***

## 2022-09-25 NOTE — Patient Instructions (Addendum)
Thank you for coming in today.   You received an injection today. Seek immediate medical attention if the joint becomes red, extremely painful, or is oozing fluid.   Plan for MRI.   Let me know how this goes.

## 2022-10-07 ENCOUNTER — Ambulatory Visit
Admission: RE | Admit: 2022-10-07 | Discharge: 2022-10-07 | Disposition: A | Discharge status: Home / Self Care | Payer: 59 | Source: Ambulatory Visit | Attending: Family Medicine | Admitting: Family Medicine

## 2022-10-07 DIAGNOSIS — G8929 Other chronic pain: Secondary | ICD-10-CM

## 2022-10-07 DIAGNOSIS — M23222 Derangement of posterior horn of medial meniscus due to old tear or injury, left knee: Secondary | ICD-10-CM | POA: Diagnosis not present

## 2022-10-07 DIAGNOSIS — M25562 Pain in left knee: Secondary | ICD-10-CM | POA: Diagnosis not present

## 2022-10-09 ENCOUNTER — Encounter
Admission: RE | Disposition: A | Discharge status: Home / Self Care | Payer: Self-pay | Source: Ambulatory Visit | Attending: Gastroenterology

## 2022-10-09 ENCOUNTER — Ambulatory Visit
Admission: RE | Admit: 2022-10-09 | Discharge: 2022-10-09 | Disposition: A | Discharge status: Home / Self Care | Payer: 59 | Source: Ambulatory Visit | Attending: Gastroenterology | Admitting: Gastroenterology

## 2022-10-09 ENCOUNTER — Ambulatory Visit: Payer: 59 | Admitting: Certified Registered"

## 2022-10-09 ENCOUNTER — Other Ambulatory Visit: Payer: Self-pay

## 2022-10-09 ENCOUNTER — Encounter: Payer: Self-pay | Admitting: *Deleted

## 2022-10-09 DIAGNOSIS — K2289 Other specified disease of esophagus: Secondary | ICD-10-CM | POA: Diagnosis not present

## 2022-10-09 DIAGNOSIS — R933 Abnormal findings on diagnostic imaging of other parts of digestive tract: Secondary | ICD-10-CM | POA: Insufficient documentation

## 2022-10-09 DIAGNOSIS — K200 Eosinophilic esophagitis: Secondary | ICD-10-CM | POA: Diagnosis not present

## 2022-10-09 DIAGNOSIS — Z79899 Other long term (current) drug therapy: Secondary | ICD-10-CM | POA: Diagnosis not present

## 2022-10-09 DIAGNOSIS — K219 Gastro-esophageal reflux disease without esophagitis: Secondary | ICD-10-CM | POA: Insufficient documentation

## 2022-10-09 HISTORY — PX: ESOPHAGOGASTRODUODENOSCOPY (EGD) WITH PROPOFOL: SHX5813

## 2022-10-09 SURGERY — ESOPHAGOGASTRODUODENOSCOPY (EGD) WITH PROPOFOL
Anesthesia: General

## 2022-10-09 MED ORDER — MIDAZOLAM HCL 2 MG/2ML IJ SOLN
INTRAMUSCULAR | Status: AC
  Filled 2022-10-09: qty 2

## 2022-10-09 MED ORDER — MIDAZOLAM HCL 2 MG/2ML IJ SOLN
INTRAMUSCULAR | Status: DC | PRN
  Administered 2022-10-09: 2 mg via INTRAVENOUS

## 2022-10-09 MED ORDER — PROPOFOL 500 MG/50ML IV EMUL
INTRAVENOUS | Status: DC | PRN
  Administered 2022-10-09: 155 ug/kg/min via INTRAVENOUS

## 2022-10-09 MED ORDER — DEXMEDETOMIDINE HCL IN NACL 200 MCG/50ML IV SOLN
INTRAVENOUS | Status: DC | PRN
  Administered 2022-10-09: 12 ug via INTRAVENOUS

## 2022-10-09 MED ORDER — PROPOFOL 10 MG/ML IV BOLUS
INTRAVENOUS | Status: AC
  Filled 2022-10-09: qty 20

## 2022-10-09 MED ORDER — SODIUM CHLORIDE 0.9 % IV SOLN: INTRAVENOUS | Status: DC

## 2022-10-09 MED ORDER — GLYCOPYRROLATE 0.2 MG/ML IJ SOLN
INTRAMUSCULAR | Status: DC | PRN
  Administered 2022-10-09: .2 mg via INTRAVENOUS

## 2022-10-09 MED ORDER — PROPOFOL 10 MG/ML IV BOLUS
INTRAVENOUS | Status: DC | PRN
  Administered 2022-10-09: 30 mg via INTRAVENOUS
  Administered 2022-10-09: 70 mg via INTRAVENOUS

## 2022-10-09 MED ORDER — LIDOCAINE HCL (CARDIAC) PF 100 MG/5ML IV SOSY
PREFILLED_SYRINGE | INTRAVENOUS | Status: DC | PRN
  Administered 2022-10-09: 100 mg via INTRAVENOUS

## 2022-10-09 NOTE — Interval H&P Note (Signed)
History and Physical Interval Note:  10/09/2022 8:39 AM  Frank Young  has presented today for surgery, with the diagnosis of GERD.  The various methods of treatment have been discussed with the patient and family. After consideration of risks, benefits and other options for treatment, the patient has consented to  Procedure(s): ESOPHAGOGASTRODUODENOSCOPY (EGD) WITH PROPOFOL (N/A) as a surgical intervention.  The patient's history has been reviewed, patient examined, no change in status, stable for surgery.  I have reviewed the patient's chart and labs.  Questions were answered to the patient's satisfaction.     Regis Bill  Ok to proceed with EGD

## 2022-10-09 NOTE — Anesthesia Preprocedure Evaluation (Signed)
Anesthesia Evaluation  Patient identified by MRN, date of birth, ID band Patient awake    Reviewed: Allergy & Precautions, H&P , NPO status , Patient's Chart, lab work & pertinent test results, reviewed documented beta blocker date and time   History of Anesthesia Complications Negative for: history of anesthetic complications  Airway Mallampati: I  TM Distance: >3 FB Neck ROM: full    Dental  (+) Dental Advidsory Given, Chipped, Teeth Intact   Pulmonary neg pulmonary ROS   Pulmonary exam normal breath sounds clear to auscultation       Cardiovascular Exercise Tolerance: Good negative cardio ROS Normal cardiovascular exam Rhythm:regular Rate:Normal     Neuro/Psych  PSYCHIATRIC DISORDERS Anxiety Depression    negative neurological ROS     GI/Hepatic Neg liver ROS,GERD  ,,  Endo/Other  negative endocrine ROS    Renal/GU negative Renal ROS  negative genitourinary   Musculoskeletal   Abdominal   Peds  Hematology negative hematology ROS (+)   Anesthesia Other Findings Past Medical History: No date: Allergy     Comment:  claritin prn No date: Anxiety     Comment:  sometimes gets concerned about medical issues. stress               with work.  No date: Chronic tension headaches     Comment:  improved with change in pillow- monitoring to see if               will still occur No date: Depression     Comment:  College No date: GERD (gastroesophageal reflux disease)     Comment:  prilosec 20mg  since weight gain- wants to be closer to               190 then trial off.  2020: Lichen sclerosus   Reproductive/Obstetrics negative OB ROS                             Anesthesia Physical Anesthesia Plan  ASA: 2  Anesthesia Plan: General   Post-op Pain Management:    Induction: Intravenous  PONV Risk Score and Plan: 2 and Propofol infusion and TIVA  Airway Management Planned: Natural  Airway and Nasal Cannula  Additional Equipment:   Intra-op Plan:   Post-operative Plan:   Informed Consent: I have reviewed the patients History and Physical, chart, labs and discussed the procedure including the risks, benefits and alternatives for the proposed anesthesia with the patient or authorized representative who has indicated his/her understanding and acceptance.     Dental Advisory Given  Plan Discussed with: Anesthesiologist, CRNA and Surgeon  Anesthesia Plan Comments:        Anesthesia Quick Evaluation

## 2022-10-09 NOTE — Anesthesia Procedure Notes (Signed)
Procedure Name: General with mask airway Date/Time: 10/09/2022 8:41 AM  Performed by: Mohammed Kindle, CRNAPre-anesthesia Checklist: Patient identified, Emergency Drugs available, Suction available and Patient being monitored Patient Re-evaluated:Patient Re-evaluated prior to induction Oxygen Delivery Method: Simple face mask Induction Type: IV induction Placement Confirmation: positive ETCO2, CO2 detector and breath sounds checked- equal and bilateral Dental Injury: Teeth and Oropharynx as per pre-operative assessment

## 2022-10-09 NOTE — Op Note (Signed)
[facility] Patient Name: Frank Young Procedure Date: 10/09/2022 8:23 AM MRN: [id number] Account #: [id number] Date of Birth: [birth year] Admit Type: Outpatient Age: 37 Room: [facility] ENDO ROOM 1 Gender: Male Note Status: Finalized Instrument Name: Upper Endoscope [id number] Procedure:             Upper GI endoscopy Indications:           Abnormal UGI series Providers:             Eather Colas MD, MD Medicines:             Monitored Anesthesia Care Complications:         No immediate complications. Estimated blood loss:                         Minimal. Procedure:             Pre-Anesthesia Assessment:                        - Prior to the procedure, a History and Physical was                         performed, and patient medications and allergies were                         reviewed. The patient is competent. The risks and                         benefits of the procedure and the sedation options and                         risks were discussed with the patient. All questions                         were answered and informed consent was obtained.                         Patient identification and proposed procedure were                         verified by the physician, the nurse, the                         anesthesiologist, the anesthetist and the technician                         in the endoscopy suite. Mental Status Examination:                         alert and oriented. Airway Examination: normal                         oropharyngeal airway and neck mobility. Respiratory                         Examination: clear to auscultation. CV Examination:                         normal. Prophylactic Antibiotics: The patient does not  require prophylactic antibiotics. Prior                         Anticoagulants: The patient has taken no anticoagulant                         or antiplatelet agents. ASA Grade Assessment: II -  A                         patient with mild systemic disease. After reviewing                         the risks and benefits, the patient was deemed in                         satisfactory condition to undergo the procedure. The                         anesthesia plan was to use monitored anesthesia care                         (MAC). Immediately prior to administration of                         medications, the patient was re-assessed for adequacy                         to receive sedatives. The heart rate, respiratory                         rate, oxygen saturations, blood pressure, adequacy of                         pulmonary ventilation, and response to care were                         monitored throughout the procedure. The physical                         status of the patient was re-assessed after the                         procedure.                        After obtaining informed consent, the endoscope was                         passed under direct vision. Throughout the procedure,                         the patient's blood pressure, pulse, and oxygen                         saturations were monitored continuously. The Endoscope                         was introduced through the mouth, and advanced to the  second part of duodenum. The upper GI endoscopy was                         accomplished without difficulty. The patient tolerated                         the procedure well. Findings:      The Z-line was irregular.      The exam of the esophagus was otherwise normal.      Biopsies were obtained from the proximal and distal esophagus with cold       forceps for histology of suspected eosinophilic esophagitis. Estimated       blood loss was minimal.      The entire examined stomach was normal.      The examined duodenum was normal. Impression:            - Z-line irregular.                        - Normal stomach.                        - Normal  examined duodenum.                        - Biopsies were taken with a cold forceps for                         evaluation of eosinophilic esophagitis. Recommendation:        - Discharge patient to home.                        - Resume previous diet.                        - Continue present medications.                        - Await pathology results.                        - Return to referring physician as previously                         scheduled. Procedure Code(s):     --- Professional ---                        [phone], Esophagogastroduodenoscopy, flexible,                         transoral; with biopsy, single or multiple Diagnosis Code(s):     --- Professional ---                        K22.89, Other specified disease of esophagus                        R93.3, Abnormal findings on diagnostic imaging of                         other parts of digestive tract CPT copyright 2022 American Medical Association. All rights reserved. The  codes documented in this report are preliminary and upon coder review may  be revised to meet current compliance requirements. Eather Colas MD, MD 10/09/2022 8:58:00 AM Number of Addenda: 0 Note Initiated On: 10/09/2022 8:23 AM Estimated Blood Loss:  Estimated blood loss was minimal.      [facility]

## 2022-10-09 NOTE — Transfer of Care (Signed)
Immediate Anesthesia Transfer of Care Note  Patient: Frank Young  Procedure(s) Performed: ESOPHAGOGASTRODUODENOSCOPY (EGD) WITH PROPOFOL  Patient Location: Endoscopy Unit  Anesthesia Type:General  Level of Consciousness: drowsy and patient cooperative  Airway & Oxygen Therapy: Patient Spontanous Breathing and Patient connected to face mask oxygen  Post-op Assessment: Report given to RN and Post -op Vital signs reviewed and stable  Post vital signs: Reviewed and stable  Last Vitals:  Vitals Value Taken Time  BP 107/69 10/09/22 0853  Temp    Pulse 66 10/09/22 0854  Resp 16 10/09/22 0854  SpO2 100 % 10/09/22 0854  Vitals shown include unvalidated device data.  Last Pain:  Vitals:   10/09/22 0853  TempSrc:   PainSc: Asleep         Complications: No notable events documented.

## 2022-10-09 NOTE — H&P (Signed)
Outpatient short stay form Pre-procedure 10/09/2022  Regis Bill, MD  Primary Physician: Shelva Majestic, MD  Reason for visit:  Abnormal barium swallow  History of present illness:    37 y/o gentleman with history of GERD here for EGD due to barium swallow showing irregularity in stomach. No blood thinners. Father had a GIST tumor. No significant neck surgeries.    Current Facility-Administered Medications:    0.9 %  sodium chloride infusion, , Intravenous, Continuous, Wohl, Darren, MD, Last Rate: 20 mL/hr at 10/09/22 0807, Continued from Pre-op at 10/09/22 0807  Facility-Administered Medications Ordered in Other Encounters:    glycopyrrolate (ROBINUL) injection, , Intravenous, Anesthesia Intra-op, Fletcher-Harrison, Tawana, CRNA, 0.2 mg at 10/09/22 1610  Medications Prior to Admission  Medication Sig Dispense Refill Last Dose   azelastine (ASTELIN) 0.1 % nasal spray Place 1 spray into both nostrils 2 (two) times daily. Use in each nostril as directed 30 mL 5 10/09/2022   augmented betamethasone dipropionate (DIPROLENE-AF) 0.05 % ointment Apply topically.      augmented betamethasone dipropionate (DIPROLENE-AF) 0.05 % ointment Apply topically once as needed. 30 g 1    azelastine (ASTELIN) 0.1 % nasal spray PLACE 1-2 SPRAYS IN EACH NOSTRIL TWICE DAILY 30 mL 12    B Complex Vitamins (B-COMPLEX/B-12 PO) Take 1,000 mcg by mouth daily.      Dexlansoprazole 30 MG capsule DR Take 1 capsule (30 mg total) by mouth daily. 30 capsule 11    fexofenadine (ALLEGRA) 180 MG tablet Take 180 mg by mouth daily.      fluticasone (FLONASE) 50 MCG/ACT nasal spray Place 1 spray into both nostrils daily as needed for allergies or rhinitis.      Meth-Hyo-M Bl-Na Phos-Ph Sal (URIBEL) 118 MG CAPS Take 1 capsule by mouth Three (3) times a day as needed (burning with urination). 21 capsule 0    oxyCODONE (OXY IR/ROXICODONE) 5 MG immediate release tablet Take 1 tablet (5 mg total) by mouth every four (4)  hours as needed for pain. (Patient not taking: Reported on 09/25/2022) 6 tablet 0      No Known Allergies   Past Medical History:  Diagnosis Date   Allergy    claritin prn   Anxiety    sometimes gets concerned about medical issues. stress with work.    Chronic tension headaches    improved with change in pillow- monitoring to see if will still occur   Depression    [employer]   GERD (gastroesophageal reflux disease)    prilosec 20mg  since weight gain- wants to be closer to 190 then trial off.    Lichen sclerosus 2020    Review of systems:  Otherwise negative.    Physical Exam  Gen: Alert, oriented. Appears stated age.  HEENT: PERRLA. Lungs: No respiratory distress CV: RRR Abd: soft, benign, no masses Ext: No edema   Planned procedures: Proceed with EGD. The patient understands the nature of the planned procedure, indications, risks, alternatives and potential complications including but not limited to bleeding, infection, perforation, damage to internal organs and possible oversedation/side effects from anesthesia. The patient agrees and gives consent to proceed.  Please refer to procedure notes for findings, recommendations and patient disposition/instructions.     Regis Bill, MD [facility]

## 2022-10-10 ENCOUNTER — Encounter: Payer: Self-pay | Admitting: Gastroenterology

## 2022-10-10 LAB — SURGICAL PATHOLOGY

## 2022-10-12 DIAGNOSIS — Z302 Encounter for sterilization: Secondary | ICD-10-CM | POA: Diagnosis not present

## 2022-10-13 NOTE — Anesthesia Postprocedure Evaluation (Signed)
Anesthesia Post Note  Patient: Frank Young  Procedure(s) Performed: ESOPHAGOGASTRODUODENOSCOPY (EGD) WITH PROPOFOL  Patient location during evaluation: Endoscopy Anesthesia Type: General Level of consciousness: awake and alert Pain management: pain level controlled Vital Signs Assessment: post-procedure vital signs reviewed and stable Respiratory status: spontaneous breathing, nonlabored ventilation, respiratory function stable and patient connected to nasal cannula oxygen Cardiovascular status: blood pressure returned to baseline and stable Postop Assessment: no apparent nausea or vomiting Anesthetic complications: no   No notable events documented.   Last Vitals:  Vitals:   10/09/22 0853 10/09/22 0903  BP: 107/69 114/73  Pulse: 72 63  Resp: 20 13  Temp:    SpO2: 100% 98%    Last Pain:  Vitals:   10/10/22 0725  TempSrc:   PainSc: 0-No pain                 Lenard Simmer

## 2022-10-15 ENCOUNTER — Encounter: Payer: Self-pay | Admitting: Family Medicine

## 2022-10-15 ENCOUNTER — Other Ambulatory Visit: Payer: Self-pay

## 2022-10-15 DIAGNOSIS — G8929 Other chronic pain: Secondary | ICD-10-CM

## 2022-10-15 NOTE — Progress Notes (Signed)
Knee MRI shows a tear at the posterior horn of the meniscus.  This is a potentially repairable more than just debrided well.  Would you like a referral to orthopedics?  I have a few that I would recommend for this surgical consultation.

## 2022-10-18 ENCOUNTER — Other Ambulatory Visit: Payer: Self-pay

## 2022-10-18 ENCOUNTER — Other Ambulatory Visit (HOSPITAL_BASED_OUTPATIENT_CLINIC_OR_DEPARTMENT_OTHER): Payer: Self-pay

## 2022-10-18 ENCOUNTER — Ambulatory Visit (INDEPENDENT_AMBULATORY_CARE_PROVIDER_SITE_OTHER): Payer: 59 | Admitting: Orthopaedic Surgery

## 2022-10-18 DIAGNOSIS — S83232A Complex tear of medial meniscus, current injury, left knee, initial encounter: Secondary | ICD-10-CM

## 2022-10-18 MED ORDER — ASPIRIN 325 MG PO TBEC
325.0000 mg | DELAYED_RELEASE_TABLET | Freq: Every day | ORAL | 0 refills | Status: DC
  Filled 2022-10-18: qty 14, 14d supply, fill #0

## 2022-10-18 MED ORDER — OXYCODONE HCL 5 MG PO TABS
5.0000 mg | ORAL_TABLET | ORAL | 0 refills | Status: DC | PRN
  Filled 2022-10-18: qty 5, 1d supply, fill #0

## 2022-10-18 NOTE — H&P (View-Only) (Signed)
                               Chief Complaint: Left knee pain     History of Present Illness:    Frank Young is a 36 y.o. male presents with ongoing left knee pain in the setting of atraumatic medial based clicking and pain particularly since the fall of the previous year.  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  This has been very difficult for him as he has have intermittent pain about the medial aspect of the knee where he does feel like it is going to give out.  He has trialed activity restriction as well as anti-inflammatories.  He is here today for further discussion    Surgical History:   None  PMH/PSH/Family History/Social History/Meds/Allergies:    Past Medical History:  Diagnosis Date   Allergy    claritin prn   Anxiety    sometimes gets concerned about medical issues. stress with work.    Chronic tension headaches    improved with change in pillow- monitoring to see if will still occur   Depression    [employer]   GERD (gastroesophageal reflux disease)    prilosec 20mg since weight gain- wants to be closer to 190 then trial off.    Lichen sclerosus 2020   Past Surgical History:  Procedure Laterality Date   24 HOUR PH STUDY N/A 06/01/2020   Procedure: 24 HOUR PH STUDY;  Surgeon: Cirigliano, Vito V, DO;  Location: [facility];  Service: Gastroenterology;  Laterality: N/A;   CIRCUMCISION  08/2018   ESOPHAGEAL MANOMETRY N/A 06/01/2020   Procedure: ESOPHAGEAL MANOMETRY (EM);  Surgeon: Cirigliano, Vito V, DO;  Location: [facility];  Service: Gastroenterology;  Laterality: N/A;   ESOPHAGOGASTRODUODENOSCOPY (EGD) WITH PROPOFOL N/A 10/09/2022   Procedure: ESOPHAGOGASTRODUODENOSCOPY (EGD) WITH PROPOFOL;  Surgeon: Locklear, Cameron T, MD;  Location: [facility];  Service: Endoscopy;  Laterality: N/A;   UPPER GASTROINTESTINAL ENDOSCOPY  01/07/2020   WISDOM TOOTH EXTRACTION Bilateral 2002   Social History    Socioeconomic History   Marital status: Married    Spouse name: Not on file   Number of children: 1   Years of education: Not on file   Highest education level: Not on file  Occupational History   Occupation: physician  Tobacco Use   Smoking status: Never   Smokeless tobacco: Never  Vaping Use   Vaping Use: Never used  Substance and Sexual Activity   Alcohol use: Yes    Alcohol/week: 1.0 - 2.0 standard drink of alcohol    Types: 1 - 2 Cans of beer per week    Comment: weekly   Drug use: No   Sexual activity: Yes  Other Topics Concern   Not on file  Social History Narrative   Married. 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      Enjoys golfing   Social Determinants of Health   Financial Resource Strain: Not on file  Food Insecurity: Not on file  Transportation Needs: Not on file  Physical Activity: Not on file  Stress: Not on file  Social Connections: Not on file   Family History  Problem Relation Age of Onset   Healthy Mother    Cancer Father          GIST   Lung cancer Maternal Grandmother        smoker   Diabetes Maternal Grandfather    Stroke Maternal Grandfather    Skin cancer Paternal Grandmother    Arthritis/Rheumatoid Paternal Grandmother    Skin cancer Paternal Grandfather    Heart disease Paternal Grandfather        CABG in 80s   Colon cancer Paternal Grandfather    Testicular cancer Paternal Uncle    Hypertension Paternal Uncle    Hyperlipidemia Paternal Uncle    Esophageal cancer Neg Hx    Rectal cancer Neg Hx    Stomach cancer Neg Hx    No Known Allergies Current Outpatient Medications  Medication Sig Dispense Refill   aspirin EC 325 MG tablet Take 1 tablet (325 mg total) by mouth daily. 14 tablet 0   oxyCODONE (OXY IR/ROXICODONE) 5 MG immediate release tablet Take 1 tablet (5 mg total) by mouth every 4 (four) hours as needed (severe pain). 5  tablet 0   augmented betamethasone dipropionate (DIPROLENE-AF) 0.05 % ointment Apply topically.     augmented betamethasone dipropionate (DIPROLENE-AF) 0.05 % ointment Apply topically once as needed. 30 g 1   azelastine (ASTELIN) 0.1 % nasal spray PLACE 1-2 SPRAYS IN EACH NOSTRIL TWICE DAILY 30 mL 12   azelastine (ASTELIN) 0.1 % nasal spray Place 1 spray into both nostrils 2 (two) times daily. Use in each nostril as directed 30 mL 5   B Complex Vitamins (B-COMPLEX/B-12 PO) Take 1,000 mcg by mouth daily.     Dexlansoprazole 30 MG capsule DR Take 1 capsule (30 mg total) by mouth daily. 30 capsule 11   fexofenadine (ALLEGRA) 180 MG tablet Take 180 mg by mouth daily.     fluticasone (FLONASE) 50 MCG/ACT nasal spray Place 1 spray into both nostrils daily as needed for allergies or rhinitis.     Meth-Hyo-M Bl-Na Phos-Ph Sal (URIBEL) 118 MG CAPS Take 1 capsule by mouth Three (3) times a day as needed (burning with urination). 21 capsule 0   oxyCODONE (OXY IR/ROXICODONE) 5 MG immediate release tablet Take 1 tablet (5 mg total) by mouth every four (4) hours as needed for pain. (Patient not taking: Reported on 09/25/2022) 6 tablet 0   No current facility-administered medications for this visit.   No results found.  Review of Systems:   A ROS was performed including pertinent positives and negatives as documented in the HPI.  Physical Exam :   Constitutional: NAD and appears stated age Neurological: Alert and oriented Psych: Appropriate affect and cooperative There were no vitals taken for this visit.   Comprehensive Musculoskeletal Exam:      Musculoskeletal Exam  Gait Normal  Alignment Normal   Right Left  Inspection Normal Normal  Palpation    Tenderness None Medial joint line  Crepitus None None  Effusion None Trace  Range of Motion    Extension -3 -3  Flexion 135 135  Strength    Extension 5/5 5/5  Flexion 5/5 5/5  Ligament Exam     Generalized Laxity No No  Lachman Negative  Negative   Pivot Shift Negative Negative  Anterior Drawer Negative Negative  Valgus at 0 Negative Negative  Valgus at 20 Negative Negative  Varus at 0 0 0  Varus at 20   0 0  Posterior Drawer at 90 0 0  Vascular/Lymphatic Exam    Edema None None  Venous Stasis Changes No No  Distal Circulation Normal Normal  Neurologic      Light Touch Sensation Intact Intact  Special Tests: Positive McMurray medially left knee     Imaging:   Xray (4 views left knee): Normal  MRI (left knee): There is a complex tear involving the mid posterior third of the medial meniscus on the left with horizontal cleavage component  I personally reviewed and interpreted the radiographs.   Assessment:   36 y.o. male with a left knee medial meniscal tear which overall has become quite symptomatic for him.  He has trialed activity restriction as well as anti-inflammatories including NSAIDs.  None of this has given him relief.  At this time he is persistently having on and off pain.  He has trialed a home strengthening program which was guided and is no longer providing any type of relief.  Given this we did discuss surgical options including meniscal repair.  I did discuss that I do believe that overall the horizontal cleavage component of this is repairable.  We did discuss the corresponding recovery protocol.  We did discuss that typically this would allow for early weightbearing.  After further discussion he is elected for this  Plan :    -Plan for left knee arthroscopy with medial meniscal repair   After a lengthy discussion of treatment options, including risks, benefits, alternatives, complications of surgical and nonsurgical conservative options, the patient elected surgical repair.   The patient  is aware of the material risks  and complications including, but not limited to injury to adjacent structures, neurovascular injury, infection, numbness, bleeding, implant failure, thermal burns, stiffness,  persistent pain, failure to heal, disease transmission from allograft, need for further surgery, dislocation, anesthetic risks, blood clots, risks of death,and others. The probabilities of surgical success and failure discussed with patient given their particular co-morbidities.The time and nature of expected rehabilitation and recovery was discussed.The patient's questions were all answered preoperatively.  No barriers to understanding were noted. I explained the natural history of the disease process and Rx rationale.  I explained to the patient what I considered to be reasonable expectations given their personal situation.  The final treatment plan was arrived at through a shared patient decision making process model.      I personally saw and evaluated the patient, and participated in the management and treatment plan.  Allana Shrestha, MD Attending Physician, Orthopedic Surgery  This document was dictated using Dragon voice recognition software. A reasonable attempt at proof reading has been made to minimize errors. 

## 2022-10-18 NOTE — Progress Notes (Signed)
Chief Complaint: Left knee pain     History of Present Illness:    Frank Young is a 37 y.o. male presents with ongoing left knee pain in the setting of atraumatic medial based clicking and pain particularly since the fall of the previous year.  He is a Development worker, community in [place].  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  This has been very difficult for him as he has have intermittent pain about the medial aspect of the knee where he does feel like it is going to give out.  He has trialed activity restriction as well as anti-inflammatories.  He is here today for further discussion    Surgical History:   None  PMH/PSH/Family History/Social History/Meds/Allergies:    Past Medical History:  Diagnosis Date   Allergy    claritin prn   Anxiety    sometimes gets concerned about medical issues. stress with work.    Chronic tension headaches    improved with change in pillow- monitoring to see if will still occur   Depression    College   GERD (gastroesophageal reflux disease)    prilosec 20mg  since weight gain- wants to be closer to 190 then trial off.    Lichen sclerosus 2020   Past Surgical History:  Procedure Laterality Date   18 HOUR PH STUDY N/A 06/01/2020   Procedure: 24 HOUR PH STUDY;  Surgeon: Shellia Cleverly, DO;  [facility];  Service: Gastroenterology;  Laterality: N/A;   CIRCUMCISION  08/2018   ESOPHAGEAL MANOMETRY N/A 06/01/2020   Procedure: ESOPHAGEAL MANOMETRY (EM);  Surgeon: Shellia Cleverly, DO;  Location: [facility];  Service: Gastroenterology;  Laterality: N/A;   ESOPHAGOGASTRODUODENOSCOPY (EGD) WITH PROPOFOL N/A 10/09/2022   Procedure: ESOPHAGOGASTRODUODENOSCOPY (EGD) WITH PROPOFOL;  Surgeon: Regis Bill, MD;  Location: [facility];  Service: Endoscopy;  Laterality: N/A;   UPPER GASTROINTESTINAL ENDOSCOPY  01/07/2020   WISDOM TOOTH EXTRACTION Bilateral 2002   Social History    Socioeconomic History   Marital status: Married    Spouse name: Not on file   Number of children: 1   Years of education: Not on file   Highest education level: Not on file  Occupational History   Occupation: physician  Tobacco Use   Smoking status: Never   Smokeless tobacco: Never  Vaping Use   Vaping Use: Never used  Substance and Sexual Activity   Alcohol use: Yes    Alcohol/week: 1.0 - 2.0 standard drink of alcohol    Types: 1 - 2 Cans of beer per week    Comment: weekly   Drug use: No   Sexual activity: Yes  Other Topics Concern   Not on file  Social History Narrative   Married. 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      Enjoys golfing   Social Determinants of Health   Financial Resource Strain: Not on file  Food Insecurity: Not on file  Transportation Needs: Not on file  Physical Activity: Not on file  Stress: Not on file  Social Connections: Not on file   Family History  Problem Relation Age of Onset   Healthy Mother    Cancer Father  GIST   Lung cancer Maternal Grandmother        smoker   Diabetes Maternal Grandfather    Stroke Maternal Grandfather    Skin cancer Paternal Grandmother    Arthritis/Rheumatoid Paternal Grandmother    Skin cancer Paternal Grandfather    Heart disease Paternal Grandfather        CABG in 73s   Colon cancer Paternal Grandfather    Testicular cancer Paternal Uncle    Hypertension Paternal Uncle    Hyperlipidemia Paternal Uncle    Esophageal cancer Neg Hx    Rectal cancer Neg Hx    Stomach cancer Neg Hx    No Known Allergies Current Outpatient Medications  Medication Sig Dispense Refill   aspirin EC 325 MG tablet Take 1 tablet (325 mg total) by mouth daily. 14 tablet 0   oxyCODONE (OXY IR/ROXICODONE) 5 MG immediate release tablet Take 1 tablet (5 mg total) by mouth every 4 (four) hours as needed (severe pain). 5  tablet 0   augmented betamethasone dipropionate (DIPROLENE-AF) 0.05 % ointment Apply topically.     augmented betamethasone dipropionate (DIPROLENE-AF) 0.05 % ointment Apply topically once as needed. 30 g 1   azelastine (ASTELIN) 0.1 % nasal spray PLACE 1-2 SPRAYS IN EACH NOSTRIL TWICE DAILY 30 mL 12   azelastine (ASTELIN) 0.1 % nasal spray Place 1 spray into both nostrils 2 (two) times daily. Use in each nostril as directed 30 mL 5   B Complex Vitamins (B-COMPLEX/B-12 PO) Take 1,000 mcg by mouth daily.     Dexlansoprazole 30 MG capsule DR Take 1 capsule (30 mg total) by mouth daily. 30 capsule 11   fexofenadine (ALLEGRA) 180 MG tablet Take 180 mg by mouth daily.     fluticasone (FLONASE) 50 MCG/ACT nasal spray Place 1 spray into both nostrils daily as needed for allergies or rhinitis.     Meth-Hyo-M Bl-Na Phos-Ph Sal (URIBEL) 118 MG CAPS Take 1 capsule by mouth Three (3) times a day as needed (burning with urination). 21 capsule 0   oxyCODONE (OXY IR/ROXICODONE) 5 MG immediate release tablet Take 1 tablet (5 mg total) by mouth every four (4) hours as needed for pain. (Patient not taking: Reported on 09/25/2022) 6 tablet 0   No current facility-administered medications for this visit.   No results found.  Review of Systems:   A ROS was performed including pertinent positives and negatives as documented in the HPI.  Physical Exam :   Constitutional: NAD and appears stated age Neurological: Alert and oriented Psych: Appropriate affect and cooperative There were no vitals taken for this visit.   Comprehensive Musculoskeletal Exam:      Musculoskeletal Exam  Gait Normal  Alignment Normal   Right Left  Inspection Normal Normal  Palpation    Tenderness None Medial joint line  Crepitus None None  Effusion None Trace  Range of Motion    Extension -3 -3  Flexion 135 135  Strength    Extension 5/5 5/5  Flexion 5/5 5/5  Ligament Exam     Generalized Laxity No No  Lachman Negative  Negative   Pivot Shift Negative Negative  Anterior Drawer Negative Negative  Valgus at 0 Negative Negative  Valgus at 20 Negative Negative  Varus at 0 0 0  Varus at 20   0 0  Posterior Drawer at 90 0 0  Vascular/Lymphatic Exam    Edema None None  Venous Stasis Changes No No  Distal Circulation Normal Normal  Neurologic  Light Touch Sensation Intact Intact  Special Tests: Positive McMurray medially left knee     Imaging:   Xray (4 views left knee): Normal  MRI (left knee): There is a complex tear involving the mid posterior third of the medial meniscus on the left with horizontal cleavage component  I personally reviewed and interpreted the radiographs.   Assessment:   37 y.o. male with a left knee medial meniscal tear which overall has become quite symptomatic for him.  He has trialed activity restriction as well as anti-inflammatories including NSAIDs.  None of this has given him relief.  At this time he is persistently having on and off pain.  He has trialed a home strengthening program which was guided and is no longer providing any type of relief.  Given this we did discuss surgical options including meniscal repair.  I did discuss that I do believe that overall the horizontal cleavage component of this is repairable.  We did discuss the corresponding recovery protocol.  We did discuss that typically this would allow for early weightbearing.  After further discussion he is elected for this  Plan :    -Plan for left knee arthroscopy with medial meniscal repair   After a lengthy discussion of treatment options, including risks, benefits, alternatives, complications of surgical and nonsurgical conservative options, the patient elected surgical repair.   The patient  is aware of the material risks  and complications including, but not limited to injury to adjacent structures, neurovascular injury, infection, numbness, bleeding, implant failure, thermal burns, stiffness,  persistent pain, failure to heal, disease transmission from allograft, need for further surgery, dislocation, anesthetic risks, blood clots, risks of death,and others. The probabilities of surgical success and failure discussed with patient given their particular co-morbidities.The time and nature of expected rehabilitation and recovery was discussed.The patient's questions were all answered preoperatively.  No barriers to understanding were noted. I explained the natural history of the disease process and Rx rationale.  I explained to the patient what I considered to be reasonable expectations given their personal situation.  The final treatment plan was arrived at through a shared patient decision making process model.      I personally saw and evaluated the patient, and participated in the management and treatment plan.  Huel Cote, MD Attending Physician, Orthopedic Surgery  This document was dictated using Dragon voice recognition software. A reasonable attempt at proof reading has been made to minimize errors.

## 2022-10-19 ENCOUNTER — Other Ambulatory Visit: Payer: Self-pay

## 2022-10-26 ENCOUNTER — Other Ambulatory Visit (HOSPITAL_BASED_OUTPATIENT_CLINIC_OR_DEPARTMENT_OTHER): Payer: Self-pay | Admitting: Orthopaedic Surgery

## 2022-10-26 ENCOUNTER — Telehealth: Payer: Self-pay | Admitting: Orthopaedic Surgery

## 2022-10-26 DIAGNOSIS — S83232A Complex tear of medial meniscus, current injury, left knee, initial encounter: Secondary | ICD-10-CM

## 2022-10-26 NOTE — Telephone Encounter (Signed)
[employer] forms received. To [employer]. 

## 2022-10-30 ENCOUNTER — Encounter (HOSPITAL_BASED_OUTPATIENT_CLINIC_OR_DEPARTMENT_OTHER): Payer: Self-pay | Admitting: Orthopaedic Surgery

## 2022-11-05 ENCOUNTER — Other Ambulatory Visit: Payer: Self-pay

## 2022-11-05 ENCOUNTER — Encounter (HOSPITAL_BASED_OUTPATIENT_CLINIC_OR_DEPARTMENT_OTHER): Payer: Self-pay | Admitting: Orthopaedic Surgery

## 2022-11-05 ENCOUNTER — Telehealth: Payer: Self-pay | Admitting: Orthopaedic Surgery

## 2022-11-05 NOTE — Telephone Encounter (Signed)
[employer] forms received. To [employer]. 

## 2022-11-12 ENCOUNTER — Ambulatory Visit (HOSPITAL_BASED_OUTPATIENT_CLINIC_OR_DEPARTMENT_OTHER): Payer: Self-pay | Admitting: Orthopaedic Surgery

## 2022-11-12 DIAGNOSIS — S83232A Complex tear of medial meniscus, current injury, left knee, initial encounter: Secondary | ICD-10-CM

## 2022-11-12 NOTE — Anesthesia Preprocedure Evaluation (Signed)
Anesthesia Evaluation  Patient identified by MRN, date of birth, ID band Patient awake    Reviewed: Allergy & Precautions, NPO status , Patient's Chart, lab work & pertinent test results  Airway Mallampati: II  TM Distance: >3 FB Neck ROM: Full    Dental no notable dental hx. (+) Teeth Intact, Dental Advisory Given   Pulmonary    Pulmonary exam normal breath sounds clear to auscultation       Cardiovascular Normal cardiovascular exam Rhythm:Regular Rate:Normal     Neuro/Psych  Headaches    GI/Hepatic Neg liver ROS,GERD  ,,  Endo/Other  negative endocrine ROS    Renal/GU negative Renal ROS     Musculoskeletal   Abdominal   Peds  Hematology   Anesthesia Other Findings   Reproductive/Obstetrics                             Anesthesia Physical Anesthesia Plan  ASA: 1  Anesthesia Plan: General   Post-op Pain Management: Regional block* and Minimal or no pain anticipated   Induction: Intravenous  PONV Risk Score and Plan: Treatment may vary due to age or medical condition, Midazolam and Ondansetron  Airway Management Planned: LMA  Additional Equipment: None  Intra-op Plan:   Post-operative Plan:   Informed Consent: I have reviewed the patients History and Physical, chart, labs and discussed the procedure including the risks, benefits and alternatives for the proposed anesthesia with the patient or authorized representative who has indicated his/her understanding and acceptance.     Dental advisory given  Plan Discussed with: CRNA  Anesthesia Plan Comments:         Anesthesia Quick Evaluation

## 2022-11-13 ENCOUNTER — Ambulatory Visit (HOSPITAL_BASED_OUTPATIENT_CLINIC_OR_DEPARTMENT_OTHER): Payer: 59 | Admitting: Anesthesiology

## 2022-11-13 ENCOUNTER — Encounter (HOSPITAL_BASED_OUTPATIENT_CLINIC_OR_DEPARTMENT_OTHER)
Admission: RE | Disposition: A | Discharge status: Home / Self Care | Payer: Self-pay | Source: Ambulatory Visit | Attending: Orthopaedic Surgery

## 2022-11-13 ENCOUNTER — Encounter (HOSPITAL_BASED_OUTPATIENT_CLINIC_OR_DEPARTMENT_OTHER): Payer: Self-pay | Admitting: Orthopaedic Surgery

## 2022-11-13 ENCOUNTER — Ambulatory Visit (HOSPITAL_COMMUNITY)
Admission: RE | Admit: 2022-11-13 | Discharge: 2022-11-13 | Disposition: A | Discharge status: Home / Self Care | Payer: 59 | Source: Ambulatory Visit | Attending: Orthopaedic Surgery | Admitting: Orthopaedic Surgery

## 2022-11-13 DIAGNOSIS — X58XXXA Exposure to other specified factors, initial encounter: Secondary | ICD-10-CM | POA: Insufficient documentation

## 2022-11-13 DIAGNOSIS — S83232A Complex tear of medial meniscus, current injury, left knee, initial encounter: Secondary | ICD-10-CM

## 2022-11-13 DIAGNOSIS — S83242D Other tear of medial meniscus, current injury, left knee, subsequent encounter: Secondary | ICD-10-CM

## 2022-11-13 DIAGNOSIS — S83242A Other tear of medial meniscus, current injury, left knee, initial encounter: Secondary | ICD-10-CM | POA: Diagnosis not present

## 2022-11-13 DIAGNOSIS — S83241A Other tear of medial meniscus, current injury, right knee, initial encounter: Secondary | ICD-10-CM | POA: Diagnosis not present

## 2022-11-13 HISTORY — PX: KNEE ARTHROSCOPY WITH MEDIAL MENISECTOMY: SHX5651

## 2022-11-13 SURGERY — ARTHROSCOPY, KNEE, WITH MEDIAL MENISCECTOMY
Anesthesia: General | Site: Knee | Laterality: Left

## 2022-11-13 MED ORDER — EPINEPHRINE PF 1 MG/ML IJ SOLN
INTRAMUSCULAR | Status: AC
  Filled 2022-11-13: qty 2

## 2022-11-13 MED ORDER — PROPOFOL 10 MG/ML IV BOLUS
INTRAVENOUS | Status: DC | PRN
  Administered 2022-11-13: 200 mg via INTRAVENOUS

## 2022-11-13 MED ORDER — MIDAZOLAM HCL 2 MG/2ML IJ SOLN
INTRAMUSCULAR | Status: AC
  Filled 2022-11-13: qty 2

## 2022-11-13 MED ORDER — GABAPENTIN 300 MG PO CAPS
ORAL_CAPSULE | ORAL | Status: AC
  Filled 2022-11-13: qty 1

## 2022-11-13 MED ORDER — DEXAMETHASONE SODIUM PHOSPHATE 10 MG/ML IJ SOLN
INTRAMUSCULAR | Status: DC | PRN
  Administered 2022-11-13: 4 mg via INTRAVENOUS

## 2022-11-13 MED ORDER — FENTANYL CITRATE (PF) 100 MCG/2ML IJ SOLN
INTRAMUSCULAR | Status: DC | PRN
  Administered 2022-11-13: 50 ug via INTRAVENOUS

## 2022-11-13 MED ORDER — MIDAZOLAM HCL 5 MG/5ML IJ SOLN
INTRAMUSCULAR | Status: DC | PRN
  Administered 2022-11-13: 2 mg via INTRAVENOUS

## 2022-11-13 MED ORDER — CEFAZOLIN SODIUM-DEXTROSE 2-4 GM/100ML-% IV SOLN
INTRAVENOUS | Status: AC
  Filled 2022-11-13: qty 100

## 2022-11-13 MED ORDER — FENTANYL CITRATE (PF) 100 MCG/2ML IJ SOLN
INTRAMUSCULAR | Status: AC
  Filled 2022-11-13: qty 2

## 2022-11-13 MED ORDER — OXYCODONE HCL 5 MG PO TABS: 5.0000 mg | ORAL_TABLET | Freq: Once | ORAL | Status: DC | PRN

## 2022-11-13 MED ORDER — TRANEXAMIC ACID-NACL 1000-0.7 MG/100ML-% IV SOLN
INTRAVENOUS | Status: AC
  Filled 2022-11-13: qty 100

## 2022-11-13 MED ORDER — LIDOCAINE HCL (CARDIAC) PF 100 MG/5ML IV SOSY
PREFILLED_SYRINGE | INTRAVENOUS | Status: DC | PRN
  Administered 2022-11-13: 60 mg via INTRAVENOUS

## 2022-11-13 MED ORDER — DEXMEDETOMIDINE HCL IN NACL 80 MCG/20ML IV SOLN
INTRAVENOUS | Status: DC | PRN
  Administered 2022-11-13: 20 ug via INTRAVENOUS

## 2022-11-13 MED ORDER — ACETAMINOPHEN 500 MG PO TABS
ORAL_TABLET | ORAL | Status: AC
  Filled 2022-11-13: qty 2

## 2022-11-13 MED ORDER — AMISULPRIDE (ANTIEMETIC) 5 MG/2ML IV SOLN: 10.0000 mg | Freq: Once | INTRAVENOUS | Status: DC | PRN

## 2022-11-13 MED ORDER — ONDANSETRON HCL 4 MG/2ML IJ SOLN: 4.0000 mg | Freq: Once | INTRAMUSCULAR | Status: DC | PRN

## 2022-11-13 MED ORDER — ACETAMINOPHEN 500 MG PO TABS
1000.0000 mg | ORAL_TABLET | Freq: Once | ORAL | Status: AC
  Administered 2022-11-13: 1000 mg via ORAL

## 2022-11-13 MED ORDER — SODIUM CHLORIDE 0.9 % IR SOLN
Status: DC | PRN
  Administered 2022-11-13: 3000 mL

## 2022-11-13 MED ORDER — GABAPENTIN 300 MG PO CAPS
300.0000 mg | ORAL_CAPSULE | Freq: Once | ORAL | Status: AC
  Administered 2022-11-13: 300 mg via ORAL

## 2022-11-13 MED ORDER — PROPOFOL 500 MG/50ML IV EMUL
INTRAVENOUS | Status: DC | PRN
  Administered 2022-11-13: 25 ug/kg/min via INTRAVENOUS

## 2022-11-13 MED ORDER — HYDROMORPHONE HCL 1 MG/ML IJ SOLN: 0.2500 mg | INTRAMUSCULAR | Status: DC | PRN

## 2022-11-13 MED ORDER — ACETAMINOPHEN 10 MG/ML IV SOLN: 1000.0000 mg | Freq: Once | INTRAVENOUS | Status: DC | PRN

## 2022-11-13 MED ORDER — OXYCODONE HCL 5 MG/5ML PO SOLN: 5.0000 mg | Freq: Once | ORAL | Status: DC | PRN

## 2022-11-13 MED ORDER — BUPIVACAINE HCL (PF) 0.25 % IJ SOLN
INTRAMUSCULAR | Status: DC | PRN
  Administered 2022-11-13: 30 mL via INTRA_ARTICULAR

## 2022-11-13 MED ORDER — CEFAZOLIN SODIUM-DEXTROSE 2-4 GM/100ML-% IV SOLN
2.0000 g | INTRAVENOUS | Status: AC
  Administered 2022-11-13: 2 g via INTRAVENOUS

## 2022-11-13 MED ORDER — LACTATED RINGERS IV SOLN: INTRAVENOUS | Status: DC

## 2022-11-13 MED ORDER — TRANEXAMIC ACID-NACL 1000-0.7 MG/100ML-% IV SOLN
1000.0000 mg | INTRAVENOUS | Status: AC
  Administered 2022-11-13: 1000 mg via INTRAVENOUS

## 2022-11-13 MED ORDER — ONDANSETRON HCL 4 MG/2ML IJ SOLN
INTRAMUSCULAR | Status: DC | PRN
  Administered 2022-11-13: 4 mg via INTRAVENOUS

## 2022-11-13 SURGICAL SUPPLY — 63 items
APL PRP STRL LF DISP 70% ISPRP (MISCELLANEOUS) ×1
BANDAGE ESMARK 6X9 LF (GAUZE/BANDAGES/DRESSINGS) IMPLANT
BLADE EXCALIBUR 4.0X13 (MISCELLANEOUS) IMPLANT
BLADE SURG 15 STRL LF DISP TIS (BLADE) IMPLANT
BLADE SURG 15 STRL SS (BLADE)
BNDG CMPR 5X4 KNIT ELC UNQ LF (GAUZE/BANDAGES/DRESSINGS) ×1
BNDG CMPR 5X62 HK CLSR LF (GAUZE/BANDAGES/DRESSINGS) ×1
BNDG CMPR 6"X 5 YARDS HK CLSR (GAUZE/BANDAGES/DRESSINGS) ×1
BNDG CMPR 9X6 STRL LF SNTH (GAUZE/BANDAGES/DRESSINGS)
BNDG ELASTIC 4INX 5YD STR LF (GAUZE/BANDAGES/DRESSINGS) ×1 IMPLANT
BNDG ELASTIC 6INX 5YD STR LF (GAUZE/BANDAGES/DRESSINGS) ×1 IMPLANT
BNDG ESMARK 6X9 LF (GAUZE/BANDAGES/DRESSINGS)
CHLORAPREP W/TINT 26 (MISCELLANEOUS) ×1 IMPLANT
COOLER ICEMAN CLASSIC (MISCELLANEOUS) ×1 IMPLANT
CUFF TOURN SGL QUICK 34 (TOURNIQUET CUFF) ×1
CUFF TRNQT CYL 34X4.125X (TOURNIQUET CUFF) IMPLANT
DISSECTOR 3.8MM X 13CM (MISCELLANEOUS) ×1 IMPLANT
DRAPE ARTHROSCOPY W/POUCH 90 (DRAPES) ×1 IMPLANT
DRAPE IMP U-DRAPE 54X76 (DRAPES) ×1 IMPLANT
DRAPE INCISE IOBAN 66X45 STRL (DRAPES) IMPLANT
DRAPE U-SHAPE 47X51 STRL (DRAPES) ×1 IMPLANT
DW OUTFLOW CASSETTE/TUBE SET (MISCELLANEOUS) ×1 IMPLANT
ELECT REM PT RETURN 9FT ADLT (ELECTROSURGICAL) ×1
ELECTRODE REM PT RTRN 9FT ADLT (ELECTROSURGICAL) IMPLANT
EXCALIBUR 3.8MM X 13CM (MISCELLANEOUS) IMPLANT
GAUZE 4X4 16PLY ~~LOC~~+RFID DBL (SPONGE) IMPLANT
GAUZE PAD ABD 8X10 STRL (GAUZE/BANDAGES/DRESSINGS) ×1 IMPLANT
GAUZE SPONGE 4X4 12PLY STRL (GAUZE/BANDAGES/DRESSINGS) ×1 IMPLANT
GAUZE XEROFORM 1X8 LF (GAUZE/BANDAGES/DRESSINGS) ×1 IMPLANT
GLOVE BIO SURGEON STRL SZ 6 (GLOVE) ×1 IMPLANT
GLOVE BIO SURGEON STRL SZ7.5 (GLOVE) ×1 IMPLANT
GLOVE BIOGEL PI IND STRL 6.5 (GLOVE) ×1 IMPLANT
GLOVE BIOGEL PI IND STRL 7.0 (GLOVE) IMPLANT
GLOVE BIOGEL PI IND STRL 8 (GLOVE) ×1 IMPLANT
GLOVE SURG SS PI 6.5 STRL IVOR (GLOVE) IMPLANT
GLOVE SURG SYN 8.0 (GLOVE) ×2 IMPLANT
GLOVE SURG SYN 8.0 PF PI (GLOVE) IMPLANT
GOWN STRL REUS W/ TWL LRG LVL3 (GOWN DISPOSABLE) ×1 IMPLANT
GOWN STRL REUS W/ TWL XL LVL3 (GOWN DISPOSABLE) ×1 IMPLANT
GOWN STRL REUS W/TWL LRG LVL3 (GOWN DISPOSABLE) ×1
GOWN STRL REUS W/TWL XL LVL3 (GOWN DISPOSABLE) ×2
LOOP 2 FIBERLINK CLOSED (SUTURE) IMPLANT
MANIFOLD NEPTUNE II (INSTRUMENTS) ×1 IMPLANT
NDL HYPO 18GX1.5 BLUNT FILL (NEEDLE) ×1 IMPLANT
NDL SAFETY ECLIP 18X1.5 (MISCELLANEOUS) ×1 IMPLANT
NDL SUT 2-0 SCORPION KNEE (NEEDLE) IMPLANT
NEEDLE HYPO 18GX1.5 BLUNT FILL (NEEDLE) ×1 IMPLANT
NEEDLE SUT 2-0 SCORPION KNEE (NEEDLE) IMPLANT
PACK ARTHROSCOPY DSU (CUSTOM PROCEDURE TRAY) ×1 IMPLANT
PACK BASIN DAY SURGERY FS (CUSTOM PROCEDURE TRAY) ×1 IMPLANT
PAD COLD SHLDR WRAP-ON (PAD) ×1 IMPLANT
PADDING CAST COTTON 6X4 STRL (CAST SUPPLIES) ×1 IMPLANT
PENCIL SMOKE EVACUATOR (MISCELLANEOUS) IMPLANT
PORT APPOLLO RF 90DEGREE MULTI (SURGICAL WAND) ×1 IMPLANT
SLEEVE SCD COMPRESS KNEE MED (STOCKING) ×1 IMPLANT
SUT ETHILON 3 0 PS 1 (SUTURE) ×1 IMPLANT
SUT VIC AB 2-0 SH 27 (SUTURE)
SUT VIC AB 2-0 SH 27XBRD (SUTURE) IMPLANT
SUT VIC AB 3-0 FS2 27 (SUTURE) IMPLANT
SYR 5ML LL (SYRINGE) ×1 IMPLANT
TOWEL GREEN STERILE FF (TOWEL DISPOSABLE) ×2 IMPLANT
TRAY DSU PREP LF (CUSTOM PROCEDURE TRAY) ×1 IMPLANT
TUBING ARTHROSCOPY IRRIG 16FT (MISCELLANEOUS) ×1 IMPLANT

## 2022-11-13 NOTE — Anesthesia Procedure Notes (Signed)
Procedure Name: LMA Insertion Date/Time: 11/13/2022 10:07 AM  Performed by: Tiearra Colwell, Jewel Baize, CRNAPre-anesthesia Checklist: Patient identified, Emergency Drugs available, Suction available and Patient being monitored Patient Re-evaluated:Patient Re-evaluated prior to induction Oxygen Delivery Method: Circle system utilized Preoxygenation: Pre-oxygenation with 100% oxygen Induction Type: IV induction Ventilation: Mask ventilation without difficulty LMA: LMA inserted LMA Size: 4.0 Number of attempts: 1 Airway Equipment and Method: Bite block Placement Confirmation: positive ETCO2 Tube secured with: Tape Dental Injury: Teeth and Oropharynx as per pre-operative assessment

## 2022-11-13 NOTE — Interval H&P Note (Signed)
History and Physical Interval Note:  11/13/2022 9:41 AM  Frank Young  has presented today for surgery, with the diagnosis of LEFT MEDIAL MENISCUS TEAR.  The various methods of treatment have been discussed with the patient and family. After consideration of risks, benefits and other options for treatment, the patient has consented to  Procedure(s): LEFT KNEE ARTHROSCOPY WITH MEDIAL MENISCAL REPAIR (Left) as a surgical intervention.  The patient's history has been reviewed, patient examined, no change in status, stable for surgery.  I have reviewed the patient's chart and labs.  Questions were answered to the patient's satisfaction.     Huel Cote

## 2022-11-13 NOTE — Op Note (Signed)
Date of Surgery: 11/13/2022  INDICATIONS: Frank Young is a 37 y.o.-year-old male with left knee symptomatic medial meniscal tear.  The risk and benefits of the procedure were discussed in detail and documented in the pre-operative evaluation.   PREOPERATIVE DIAGNOSIS: 1. Left knee medial meniscal tear  POSTOPERATIVE DIAGNOSIS: Same.  PROCEDURE: 1. Left knee medial meniscal debridement  SURGEON: Benancio Deeds MD  ASSISTANT: Kerby Less, ATC  ANESTHESIA:  general  IV FLUIDS AND URINE: See anesthesia record.  ANTIBIOTICS: Ancef  ESTIMATED BLOOD LOSS: 10 mL.  IMPLANTS:  * No implants in log *  DRAINS: None  CULTURES: None  COMPLICATIONS: none  DESCRIPTION OF PROCEDURE:  Examination under anesthesia: A careful examination under anesthesia was performed.  Knee ROM motion was: -5 - 135 Lachman: Normal Pivot Shift: Normal Posterior drawer: normal.   Varus stability in full extension: normal.   Varus stability in 30 degrees of flexion: normal.  Valgus stability in full extension: normal.   Valgus stability in 30 degrees of flexion: normal.  Posterolateral drawer: normal   Intra-operative findings: A thorough arthroscopic examination of the knee was performed.  The findings are: 1. Suprapatellar pouch: Normal 2. Undersurface of median ridge: Normal 3. Medial patellar facet: Normal 4. Lateral patellar facet: Normal 5. Trochlea: Normal 6. Lateral gutter/popliteus tendon: Normal 7. Hoffa's fat pad: Normal 8. Medial gutter/plica: Hypertrophied 9. ACL: Normal 10. PCL: Normal 11. Medial meniscus: The medial meniscus had a tear involving the inferior leaflet of the mid body to the posterior root without root destabilization.  12. Medial compartment cartilage: Normal 13. Lateral meniscus: Normal 14. Lateral compartment cartilage: Normal  I identified the patient in the pre-operative holding area.  I marked the operative knee with my initials. I reviewed the risks and  benefits of the proposed surgical intervention and the patient wished to proceed.  Anesthesia performed a peripheral nerve block.  Patient was subsequently taken back to the operating room.  The patient was transferred to the operative suite and placed in the supine position with all bony prominences padded.     SCDs were placed on the non-operative lower extremity. Appropriate antibiotics was administered within 1 hour before incision. The operative lower extremity was then prepped and draped in standard fashion. A time out was performed confirming the correct extremity, correct patient and correct procedure.    A standard anterolateral portal was made with an 11 blade.  The ideal position for the anteromedial portal was established using a spinal needle.  This AM portal was then created under direct visualization with an 11 blade.  A full diagnostic arthroscopy was then performed, as described above, including probing of the chondral and meniscal surfaces.     The medial meniscus had a tear involving the inferior leaflet of the mid body to the posterior root without root destabilization. This was debrided as it was deemed in the white white junction back to health margin (90% or meniscus remained). The medial plica was then resected using the shaver and wand electrocautery was used to cauterize bleeding.  That concluded the case.  Skin was closed with 2-0 Vicryl and 3-0 nylon. Xeroform gauze, gauze, Tegaderm, Iceman and brace were applied.  Instrument, sponge, and needle counts were correct prior to wound closure and at the conclusion of the case.  The patient was taken to the PACU without complication     POSTOPERATIVE PLAN: He will be weight bearing and activity as tolerated. He will be seen for 1 PT session. He  will be placed on aspirin for blood clot prevention.  Benancio Deeds, MD 10:54 AM

## 2022-11-13 NOTE — Anesthesia Postprocedure Evaluation (Signed)
Anesthesia Post Note  Patient: Frank Young  Procedure(s) Performed: LEFT KNEE ARTHROSCOPY WITH PARTIAL MEDIAL MENISCECTOMY (Left: Knee)     Patient location during evaluation: PACU Anesthesia Type: General Level of consciousness: awake and alert Pain management: pain level controlled Vital Signs Assessment: post-procedure vital signs reviewed and stable Respiratory status: spontaneous breathing, nonlabored ventilation, respiratory function stable and patient connected to nasal cannula oxygen Cardiovascular status: blood pressure returned to baseline and stable Postop Assessment: no apparent nausea or vomiting Anesthetic complications: no   No notable events documented.  Last Vitals:  Vitals:   11/13/22 1130 11/13/22 1203  BP: 113/83 121/85  Pulse: 71 76  Resp: 15 14  Temp:  (!) 36.2 C  SpO2: 99% 98%    Last Pain:  Vitals:   11/13/22 1203  TempSrc: Temporal  PainSc:                  [name]

## 2022-11-13 NOTE — Brief Op Note (Signed)
   Brief Op Note  Date of Surgery: 11/13/2022  Preoperative Diagnosis: LEFT MEDIAL MENISCUS TEAR  Postoperative Diagnosis: same  Procedure: Procedure(s): LEFT KNEE ARTHROSCOPY WITH PARTIAL MEDIAL MENISCECTOMY  Implants: * No implants in log *  Surgeons: Surgeon(s): Huel Cote, MD  Anesthesia: Regional    Estimated Blood Loss: See anesthesia record  Complications: None  Condition to PACU: Stable  Benancio Deeds, MD 11/13/2022 10:54 AM

## 2022-11-13 NOTE — Discharge Instructions (Addendum)
Discharge Instructions    Attending Surgeon: Huel Cote, MD Office Phone Number: [phone]   Diagnosis and Procedures:    Surgeries Performed: Left knee medial meniscal debridement  Discharge Plan:    Diet: Resume usual diet. Begin with light or bland foods.  Drink plenty of fluids.  Activity:  Activity and range of motion as tolerated. You are advised to go home directly from the hospital or surgical center. Restrict your activities.  GENERAL INSTRUCTIONS: 1.  Please apply ice to your wound to help with swelling and inflammation. This will improve your comfort and your overall recovery following surgery.     2. Please call Dr. Serena Croissant office at [phone] with questions Monday-Friday during business hours. If no one answers, please leave a message and someone should get back to the patient within 24 hours. For emergencies please call 911 or proceed to the emergency room.   3. Patient to notify surgical team if experiences any of the following: Bowel/Bladder dysfunction, uncontrolled pain, nerve/muscle weakness, incision with increased drainage or redness, nausea/vomiting and Fever greater than 101.0 F.  Be alert for signs of infection including redness, streaking, odor, fever or chills. Be alert for excessive pain or bleeding and notify your surgeon immediately.  WOUND INSTRUCTIONS:   Leave your dressing, cast, or splint in place until your post operative visit.  Keep it clean and dry.  Always keep the incision clean and dry until the staples/sutures are removed. If there is no drainage from the incision you should keep it open to air. If there is drainage from the incision you must keep it covered at all times until the drainage stops  Do not soak in a bath tub, hot tub, pool, lake or other body of water until 21 days after your surgery and your incision is completely dry and healed.  If you have removable sutures (or staples) they must be removed 10-14 days  (unless otherwise instructed) from the day of your surgery.     1)  Elevate the extremity as much as possible.  2)  Keep the dressing clean and dry.  3)  Please call us if the dressing becomes wet or dirty.  4)  If you are experiencing worsening pain or worsening swelling, please call.     MEDICATIONS: Resume all previous home medications at the previous prescribed dose and frequency unless otherwise noted Start taking the  pain medications on an as-needed basis as prescribed  Please taper down pain medication over the next week following surgery.  Ideally you should not require a refill of any narcotic pain medication.  Take pain medication with food to minimize nausea. In addition to the prescribed pain medication, you may take over-the-counter pain relievers such as Tylenol.  Do NOT take additional tylenol if your pain medication already has tylenol in it.  Aspirin 325mg  daily for four weeks.      FOLLOWUP INSTRUCTIONS: 1. Follow up at the Physical Therapy Clinic 3-4 days following surgery. This appointment should be scheduled unless other arrangements have been made.The Physical Therapy scheduling number is [phone] if an appointment has not already been arranged.  2. Contact Dr. Serena Croissant office during office hours at [phone] or the practice after hours line at [phone] for non-emergencies. For medical emergencies call 911.   Discharge Location: Home    Post Anesthesia Home Care Instructions  Activity: Get plenty of rest for the remainder of the day. A responsible individual must stay with you for 24 hours  following the procedure.  For the next 24 hours, DO NOT: -Drive a car -Advertising copywriter -Drink alcoholic beverages -Take any medication unless instructed by your physician -Make any legal decisions or sign important papers.  Meals: Start with liquid foods such as gelatin or soup. Progress to regular foods as tolerated. Avoid greasy, spicy, heavy foods.  If nausea and/or vomiting occur, drink only clear liquids until the nausea and/or vomiting subsides. Call your physician if vomiting continues.  Special Instructions/Symptoms: Your throat may feel dry or sore from the anesthesia or the breathing tube placed in your throat during surgery. If this causes discomfort, gargle with warm salt water. The discomfort should disappear within 24 hours.  If you had a scopolamine patch placed behind your ear for the management of post- operative nausea and/or vomiting:  1. The medication in the patch is effective for 72 hours, after which it should be removed.  Wrap patch in a tissue and discard in the trash. Wash hands thoroughly with soap and water. 2. You may remove the patch earlier than 72 hours if you experience unpleasant side effects which may include dry mouth, dizziness or visual disturbances. 3. Avoid touching the patch. Wash your hands with soap and water after contact with the patch.   Next dose of Tylenol may be given at 3:00pm if needed.

## 2022-11-13 NOTE — Transfer of Care (Signed)
Immediate Anesthesia Transfer of Care Note  Patient: Frank Young  Procedure(s) Performed: LEFT KNEE ARTHROSCOPY WITH PARTIAL MEDIAL MENISCECTOMY (Left: Knee)  Patient Location: PACU  Anesthesia Type:General  Level of Consciousness: drowsy and patient cooperative  Airway & Oxygen Therapy: Patient Spontanous Breathing and Patient connected to face mask oxygen  Post-op Assessment: Report given to RN and Post -op Vital signs reviewed and stable  Post vital signs: Reviewed and stable  Last Vitals:  Vitals Value Taken Time  BP 90/55 11/13/22 1058  Temp    Pulse 59 11/13/22 1059  Resp 10 11/13/22 1059  SpO2 98 % 11/13/22 1059  Vitals shown include unvalidated device data.  Last Pain:  Vitals:   11/13/22 0906  TempSrc: Oral  PainSc: 1       Patients Stated Pain Goal: 3 (11/13/22 0906)  Complications: No notable events documented.

## 2022-11-14 ENCOUNTER — Encounter (HOSPITAL_BASED_OUTPATIENT_CLINIC_OR_DEPARTMENT_OTHER): Payer: Self-pay | Admitting: Orthopaedic Surgery

## 2022-11-15 NOTE — Therapy (Signed)
OUTPATIENT PHYSICAL THERAPY LOWER EXTREMITY EVALUATION   Patient Name: Frank Young MRN: [id number] DOB:[birth year], 37 y.o., male Today's Date: 11/16/2022  END OF SESSION:  PT End of Session - 11/16/22 1026     Visit Number 1    Number of Visits 8    Date for PT Re-Evaluation 01/11/23    Authorization Type [place] [employer]ET[employer]    PT Start Time 1020    PT Stop Time 1110    PT Time Calculation (min) 50 min    Activity Tolerance Patient tolerated treatment well;No increased pain    Behavior During Therapy WFL for tasks assessed/performed             Past Medical History:  Diagnosis Date   Allergy    claritin prn   Anxiety    sometimes gets concerned about medical issues. stress with work.    Chronic tension headaches    improved with change in pillow- monitoring to see if will still occur   Depression    [employer]   GERD (gastroesophageal reflux disease)    prilosec 20mg  since weight gain- wants to be closer to 190 then trial off.    Lichen sclerosus 2020   Past Surgical History:  Procedure Laterality Date   63 HOUR PH STUDY N/A 06/01/2020   Procedure: 24 HOUR PH STUDY;  Surgeon: Shellia Cleverly, DO;  Location: [facility];  Service: Gastroenterology;  Laterality: N/A;   CIRCUMCISION  08/2018   ESOPHAGEAL MANOMETRY N/A 06/01/2020   Procedure: ESOPHAGEAL MANOMETRY (EM);  Surgeon: Shellia Cleverly, DO;  Location: [facility];  Service: Gastroenterology;  Laterality: N/A;   ESOPHAGOGASTRODUODENOSCOPY (EGD) WITH PROPOFOL N/A 10/09/2022   Procedure: ESOPHAGOGASTRODUODENOSCOPY (EGD) WITH PROPOFOL;  Surgeon: Regis Bill, MD;  Location: [facility];  Service: Endoscopy;  Laterality: N/A;   KNEE ARTHROSCOPY WITH MEDIAL MENISECTOMY Left 11/13/2022   Procedure: LEFT KNEE ARTHROSCOPY WITH PARTIAL MEDIAL MENISCECTOMY;  Surgeon: Huel Cote, MD;  Location: [facility];  Service: Orthopedics;  Laterality: Left;   UPPER GASTROINTESTINAL ENDOSCOPY   01/07/2020   WISDOM TOOTH EXTRACTION Bilateral 2002   Patient Active Problem List   Diagnosis Date Noted   Complex tear of medial meniscus of left knee as current injury 11/13/2022   Acute left-sided low back pain with left-sided sciatica 10/04/2021   Cubital tunnel syndrome, left 04/13/2021   Anxiety    GERD (gastroesophageal reflux disease)    Chronic tension headaches    Allergic rhinitis      REFERRING PROVIDER: Huel Cote, MD  REFERRING DIAG: S83.232A (ICD-10-CM) - Complex tear of medial meniscus of left knee as current injury, initial encounter  THERAPY DIAG:  Left knee pain, unspecified chronicity  Stiffness of left knee, not elsewhere classified  Muscle weakness (generalized)  Difficulty in walking, not elsewhere classified  Rationale for Evaluation and Treatment: Rehabilitation  ONSET DATE: DOS 11/13/2022  SUBJECTIVE:   SUBJECTIVE STATEMENT: Pt began having a clunk in his knee in November last year.  He could feel the clunk when he was squatting down while playing golf.  Pt began having pain a few months ago when he planted foot and pivoted while walking with his 46.39 year old. Pt underwent L knee medial meniscal debridement on 11/13/2022.  Post op plan indicated weight bearing and activity as tolerated.  Pt to be seen for 1 PT session.    Pt is limited with his normal functional mobility skills including ambulation, transfers, and stairs.  Pt is limited with walking  distance and standing duration.  He is limited with taking care of his kids.  Pt is unable to play golf and perform his normal workout activities.  Pt has not been using any crutches.  Pt is using ice.  Pt is not working currently since surgery and plans to return next Wednesday.     PERTINENT HISTORY: L knee arthroscopy with medial meniscectomy on 11/13/22  PAIN:  Are you having pain? Yes NPRS:  1/10 current, 5/10 worst, 0/10 best Location:  L knee  PRECAUTIONS: per surgical protocol  WEIGHT  BEARING RESTRICTIONS: Yes WBAT  FALLS:  Has patient fallen in last 6 months? 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  Pt was playing golf.  Pt was able to perform home workouts for upper and lower body.    PATIENT GOALS: return to playing golf and lifting weights.  Return to PLOF  NEXT MD VISIT: 11/29/2022  OBJECTIVE:   DIAGNOSTIC FINDINGS: Pt is post op.  He had an x ray and MRI prior to surgery.  PATIENT SURVEYS:  FOTO 47 with a goal of 76 at visit #14  COGNITION: Overall cognitive status: Within functional limits for tasks assessed     OBSERVATION: PT removed ACE bandage and post op dressings.  Pt's portals were intact with stitches and had xeroform gauze over them.  Pt had minimal drainage, nothing abnormal.  Pt had no signs of infection.  PT applied new gauze and tegaderm over portals.  PT educated pt concerning dressings and pt verbalizes understanding.  PT reapplied ACE bandage before pt left.     LOWER EXTREMITY ROM:  ROM Right eval Left eval  Hip flexion    Hip extension    Hip abduction    Hip adduction    Hip internal rotation    Hip external rotation    Knee flexion 147 74 deg PROM  Knee extension 7 deg of hyperextensio 3 deg of hyperextension at rest  Ankle dorsiflexion    Ankle plantarflexion    Ankle inversion    Ankle eversion     (Blank rows = not tested)  Strength not formally assessed due to healing constraints and surgical protocol.  Pt able to perform supine SLR without extensor lag.   GAIT: Assistive device utilized: None Level of assistance: Complete Independence Comments: Pt ambulated without an AD without increased pain with good stability.  Pt is very limited with knee flexion with gait and maintains his knee fairly straight.  Pt is limited with toe off on L.     TODAY'S TREATMENT:                                                                                                                                Pt performed quad sets with 5 sec hold, ankle  pumps x 20 reps, supine SLR approx 10 reps, and supine heel slides with hands under thigh.  Pt received a HEP handout and was instructed in correct form and appropriate frequency.  Pt was instructed he should not have pain with any of the exercises.  PT instructed pt to not perform knee flexion AAROM into a painful or tight range.  He had no pain with supine heel slides with hands under thigh.    PATIENT EDUCATION:  Education details:  Post op and protocol limitations, expectations, and restrictions, Wb'ing orders per MD, dx, prognosis, relevant anatomy, HEP, dressings, and POC.  Instructed pt in using ice.  PT instructed pt in using AD or decreasing weight on L LE if he has increased pain or feels that his knee wants to give way.   Person educated: Patient Education method: Explanation, Demonstration, Tactile cues, Verbal cues, and Handouts Education comprehension: verbalized understanding, returned demonstration, verbal cues required, and tactile cues required  HOME EXERCISE PROGRAM: Access Code: URL: [email or web address] Date: 11/16/2022 Prepared by: Aaron Edelman  Exercises - Supine Quadricep Sets  - 2 x daily - 7 x weekly - 2 sets - 10 reps - 5 seconds hold - ANKLE PUMPS  - 3-4 x daily - 7 x weekly - 2-3 sets - 10 reps - Supine Active Straight Leg Raise  - 1 x daily - 6-7 x weekly - 2 sets - 10 reps - Sitting knee flexion AAROM with Hands under Thigh  - 2-3 x daily - 7 x weekly - 1 sets - 10 reps  ASSESSMENT:  CLINICAL IMPRESSION: Patient is a 37 y.o. male 3 days s/p L knee arthroscopy with medial meniscectomy presenting to the clinic with expected post op findings of L knee pain, limited ROM in L knee, muscle weakness in L LE, and difficulty in walking.  Pt is limited  with his normal functional mobility skills including ambulation, transfers, and stairs.  Pt is limited with walking distance and standing duration.  He is limited with taking care of his kids.  Pt is unable to play golf and perform his normal workout activities.  Pt is not working currently since surgery.  Pt is able to perform supine SLR without extensor lag.  Pt should benefit from skilled PT services per protocol to address impairments and to assist with returning to PLOF.       OBJECTIVE IMPAIRMENTS: Abnormal gait, decreased activity tolerance, decreased endurance, decreased mobility, difficulty walking, decreased ROM, decreased strength, hypomobility, impaired flexibility, and pain.   ACTIVITY LIMITATIONS: standing, squatting, stairs, transfers, locomotion level, and caring for others  PARTICIPATION LIMITATIONS: cleaning, community activity, occupation, and recreational/workout activities  PERSONAL FACTORS:   REHAB POTENTIAL: Good  CLINICAL DECISION MAKING: Stable/uncomplicated  EVALUATION COMPLEXITY: Low   GOALS:   SHORT TERM GOALS: Target date: 12/07/2022  Pt will be independent and compliant with HEP for improved pain, ROM, strength, and function.  Baseline: Goal status: INITIAL   2.  Pt will demo improve L knee flexion AROM to at least 110 deg for improved stiffness and mobility.  Baseline:  Goal status: INITIAL  3.  Pt will ambulate with a normalized heel to toe gait pattern without limping.  Baseline:  Goal status: INITIAL Target date:  12/14/2022    LONG TERM GOALS: Target date: 01/11/2023  Pt will demo L knee flexion AROM to at > than 120 deg for improved mobility and stiffness.  Baseline:  Goal status: INITIAL Target date:  12/28/2022   2.  Pt will ambulate extended community distance without difficulty and increased pain.  Baseline:  Goal status: INITIAL  3.  Pt will be able to take care of his kids without significant limitations and significant pain.   Baseline:  Goal status: INITIAL  4.  Pt will score 0-1 on the lateral step down test on a 6 inch step for improved eccentric quad control and performance of stairs.  Baseline:  Goal status: INITIAL Target date:  12/28/2022  5.  Pt will perform stairs with a reciprocal gait with good control.  Baseline:  Goal status: INITIAL  6.  Pt will demo at least 4+/5 L hip strength in flex, abd, add, and ext and at least 4/5 quad strength for improved performance of and tolerance with functional mobility skills.  Baseline:  Goal status: INITIAL   PLAN:  PT FREQUENCY: 1x/week  PT DURATION: other: 6-8 weeks     PLANNED INTERVENTIONS: Therapeutic exercises, Therapeutic activity, Neuromuscular re-education, Balance training, Gait training, Patient/Family education, Self Care, Joint mobilization, Stair training, Aquatic Therapy, Dry Needling, Electrical stimulation, Cryotherapy, Moist heat, scar mobilization, Taping, Ultrasound, Manual therapy, and Re-evaluation  PLAN FOR NEXT SESSION: Cont per Dr. Serena Croissant meniscectomy protocol.  Gait training and ice.     Audie Clear III PT, DPT 11/16/22 9:21 PM

## 2022-11-16 ENCOUNTER — Ambulatory Visit (HOSPITAL_BASED_OUTPATIENT_CLINIC_OR_DEPARTMENT_OTHER): Payer: 59 | Attending: Orthopaedic Surgery | Admitting: Physical Therapy

## 2022-11-16 ENCOUNTER — Encounter (HOSPITAL_BASED_OUTPATIENT_CLINIC_OR_DEPARTMENT_OTHER): Payer: Self-pay | Admitting: Physical Therapy

## 2022-11-16 ENCOUNTER — Other Ambulatory Visit: Payer: Self-pay

## 2022-11-16 DIAGNOSIS — M25562 Pain in left knee: Secondary | ICD-10-CM | POA: Diagnosis not present

## 2022-11-16 DIAGNOSIS — R262 Difficulty in walking, not elsewhere classified: Secondary | ICD-10-CM | POA: Diagnosis not present

## 2022-11-16 DIAGNOSIS — S83232A Complex tear of medial meniscus, current injury, left knee, initial encounter: Secondary | ICD-10-CM | POA: Diagnosis not present

## 2022-11-16 DIAGNOSIS — M6281 Muscle weakness (generalized): Secondary | ICD-10-CM | POA: Insufficient documentation

## 2022-11-16 DIAGNOSIS — M25662 Stiffness of left knee, not elsewhere classified: Secondary | ICD-10-CM | POA: Diagnosis not present

## 2022-11-17 NOTE — Addendum Note (Signed)
Addended by: Susy Manor on: 11/17/2022 07:35 AM   Modules accepted: Orders

## 2022-11-21 ENCOUNTER — Other Ambulatory Visit: Payer: Self-pay

## 2022-11-21 ENCOUNTER — Encounter: Payer: Self-pay | Admitting: Family Medicine

## 2022-11-21 MED ORDER — AZELASTINE HCL 0.1 % NA SOLN
1.0000 | Freq: Two times a day (BID) | NASAL | 3 refills | Status: DC
  Filled 2022-11-21: qty 90, 300d supply, fill #0
  Filled 2022-12-13: qty 90, 90d supply, fill #0
  Filled 2023-03-12: qty 90, 90d supply, fill #1
  Filled 2023-06-15: qty 90, 90d supply, fill #2

## 2022-11-21 MED ORDER — FLUTICASONE PROPIONATE 50 MCG/ACT NA SUSP
1.0000 | Freq: Every day | NASAL | 3 refills | Status: AC | PRN
  Filled 2022-11-21: qty 16, 30d supply, fill #0
  Filled 2023-02-01: qty 16, 30d supply, fill #1
  Filled 2023-03-12: qty 16, 30d supply, fill #2
  Filled 2023-05-08: qty 16, 30d supply, fill #3

## 2022-11-22 ENCOUNTER — Ambulatory Visit (HOSPITAL_BASED_OUTPATIENT_CLINIC_OR_DEPARTMENT_OTHER): Payer: 59

## 2022-11-22 ENCOUNTER — Other Ambulatory Visit: Payer: Self-pay

## 2022-11-22 ENCOUNTER — Encounter (HOSPITAL_BASED_OUTPATIENT_CLINIC_OR_DEPARTMENT_OTHER): Payer: Self-pay

## 2022-11-22 DIAGNOSIS — R262 Difficulty in walking, not elsewhere classified: Secondary | ICD-10-CM

## 2022-11-22 DIAGNOSIS — S83232A Complex tear of medial meniscus, current injury, left knee, initial encounter: Secondary | ICD-10-CM | POA: Diagnosis not present

## 2022-11-22 DIAGNOSIS — M25662 Stiffness of left knee, not elsewhere classified: Secondary | ICD-10-CM | POA: Diagnosis not present

## 2022-11-22 DIAGNOSIS — M25562 Pain in left knee: Secondary | ICD-10-CM | POA: Diagnosis not present

## 2022-11-22 DIAGNOSIS — M6281 Muscle weakness (generalized): Secondary | ICD-10-CM

## 2022-11-22 NOTE — Therapy (Signed)
OUTPATIENT PHYSICAL THERAPY LOWER EXTREMITY TREATMENT   Patient Name: Frank Young MRN: [id number] DOB:July 16, 1985, 37 y.o., male Today's Date: 11/22/2022  END OF SESSION:  PT End of Session - 11/22/22 1251     Visit Number 2    Number of Visits 8    Date for PT Re-Evaluation 01/11/23    Authorization Type Gretta Began    PT Start Time 1302    PT Stop Time 1345    PT Time Calculation (min) 43 min    Activity Tolerance Patient tolerated treatment well    Behavior During Therapy [facility] for tasks assessed/performed              Past Medical History:  Diagnosis Date   Allergy    claritin prn   Anxiety    sometimes gets concerned about medical issues. stress with work.    Chronic tension headaches    improved with change in pillow- monitoring to see if will still occur   Depression    [employer]   GERD (gastroesophageal reflux disease)    prilosec 20mg  since weight gain- wants to be closer to 190 then trial off.    Lichen sclerosus 2020   Past Surgical History:  Procedure Laterality Date   32 HOUR PH STUDY N/A 06/01/2020   Procedure: 24 HOUR PH STUDY;  Surgeon: Shellia Cleverly, DO;  Location: [facility];  Service: Gastroenterology;  Laterality: N/A;   CIRCUMCISION  08/2018   ESOPHAGEAL MANOMETRY N/A 06/01/2020   Procedure: ESOPHAGEAL MANOMETRY (EM);  Surgeon: Shellia Cleverly, DO;  Location: [facility];  Service: Gastroenterology;  Laterality: N/A;   ESOPHAGOGASTRODUODENOSCOPY (EGD) WITH PROPOFOL N/A 10/09/2022   Procedure: ESOPHAGOGASTRODUODENOSCOPY (EGD) WITH PROPOFOL;  Surgeon: Regis Bill, MD;  Location: [facility];  Service: Endoscopy;  Laterality: N/A;   KNEE ARTHROSCOPY WITH MEDIAL MENISECTOMY Left 11/13/2022   Procedure: LEFT KNEE ARTHROSCOPY WITH PARTIAL MEDIAL MENISCECTOMY;  Surgeon: Huel Cote, MD;  Location: [facility];  Service: Orthopedics;  Laterality: Left;   UPPER GASTROINTESTINAL ENDOSCOPY  01/07/2020   WISDOM  TOOTH EXTRACTION Bilateral 2002   Patient Active Problem List   Diagnosis Date Noted   Complex tear of medial meniscus of left knee as current injury 11/13/2022   Acute left-sided low back pain with left-sided sciatica 10/04/2021   Cubital tunnel syndrome, left 04/13/2021   Anxiety    GERD (gastroesophageal reflux disease)    Chronic tension headaches    Allergic rhinitis      REFERRING PROVIDER: Huel Cote, MD  REFERRING DIAG: S83.232A (ICD-10-CM) - Complex tear of medial meniscus of left knee as current injury, initial encounter  THERAPY DIAG:  Left knee pain, unspecified chronicity  Stiffness of left knee, not elsewhere classified  Muscle weakness (generalized)  Difficulty in walking, not elsewhere classified  Rationale for Evaluation and Treatment: Rehabilitation  ONSET DATE: DOS 11/13/2022  SUBJECTIVE:   SUBJECTIVE STATEMENT: Pt reports mostly tightness in L knee, minimal to no pain. PERTINENT HISTORY: L knee arthroscopy with medial meniscectomy on 11/13/22  PAIN:  Are you having pain? Yes NPRS:  1/10 current, 5/10 worst, 0/10 best Location:  L knee  PRECAUTIONS: per surgical protocol  WEIGHT BEARING RESTRICTIONS: Yes WBAT  FALLS:  Has patient fallen in last 6 months? 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  Pt was playing golf.  Pt was able to perform home workouts for upper and lower body.    PATIENT GOALS: return to playing golf and lifting weights.  Return to PLOF  NEXT MD VISIT: 11/29/2022  OBJECTIVE:   DIAGNOSTIC FINDINGS: Pt is post op.  He had an x ray and MRI prior to surgery.  PATIENT SURVEYS:  FOTO 47 with a goal of 76 at visit #14  COGNITION: Overall cognitive status: Within functional limits for tasks assessed     OBSERVATION: PT removed ACE bandage and post  op dressings.  Pt's portals were intact with stitches and had xeroform gauze over them.  Pt had minimal drainage, nothing abnormal.  Pt had no signs of infection.  PT applied new gauze and tegaderm over portals.  PT educated pt concerning dressings and pt verbalizes understanding.  PT reapplied ACE bandage before pt left.     LOWER EXTREMITY ROM:  ROM Right eval Left eval L 6/20  Hip flexion     Hip extension     Hip abduction     Hip adduction     Hip internal rotation     Hip external rotation     Knee flexion 147 74 deg PROM 129deg AAROM  Knee extension 7 deg of hyperextensio 3 deg of hyperextension at rest   Ankle dorsiflexion     Ankle plantarflexion     Ankle inversion     Ankle eversion      (Blank rows = not tested)  Strength not formally assessed due to healing constraints and surgical protocol.  Pt able to perform supine SLR without extensor lag.   GAIT: Assistive device utilized: None Level of assistance: Complete Independence Comments: Pt ambulated without an AD without increased pain with good stability.  Pt is very limited with knee flexion with gait and maintains his knee fairly straight.  Pt is limited with toe off on L.    TODAY'S TREATMENT:                                                                                                                                -Dressing change-applied band aids -PROM L knee  -Sci-fit Bike full revs -Gait training in hall- cues for knee flexion and toe off -HR/TR x20 -Partial squats 2x10 Standing marching x10 -LAQ 3# 2x10 5" hold - supine SLR 3x10 -bridge 2x10 Sidelying hip abduction 3x10 L HEP update and review   PATIENT EDUCATION:  Education details:  Post op and protocol limitations, expectations, and restrictions, Wb'ing orders per MD, dx, prognosis, relevant anatomy, HEP, dressings, and POC.  Instructed pt in using ice.  PT instructed pt in using AD or decreasing weight on L LE if he has increased pain  or feels that his knee wants to give way.   Person educated: Patient Education method: Explanation, Demonstration, Tactile cues, Verbal cues, and Handouts Education comprehension: verbalized understanding, returned demonstration, verbal cues required, and tactile cues required  HOME EXERCISE PROGRAM: Access Code: URL: [email or web address] Date: 11/16/2022 Prepared by: Aaron Edelman  Exercises - Supine Quadricep Sets  - 2 x daily - 7 x weekly - 2 sets - 10 reps - 5 seconds hold - ANKLE PUMPS  - 3-4 x daily - 7 x weekly - 2-3 sets - 10 reps - Supine Active Straight Leg Raise  - 1 x daily - 6-7 x weekly - 2 sets - 10 reps - Sitting knee flexion AAROM with Hands under Thigh  - 2-3 x daily - 7 x weekly - 1 sets - 10 reps  ASSESSMENT:  CLINICAL IMPRESSION: Replaced dressing with band aids, allowing for increased AROM. Incision is healing well without signs of infection. Sees MD on Thursday for suture removal. Pt ambulates with minimal knee flexion, so spent time on gait training today with improved results in clinic. Initiated closed chain exercises with good tolerance. Knee flexion measured at 129deg today, significant improvement from IE.  Updated HEP for pt to progress at home. Will continue to progress as tolerated with gait, ROM, strength, and stability.   OBJECTIVE IMPAIRMENTS: Abnormal gait, decreased activity tolerance, decreased endurance, decreased mobility, difficulty walking, decreased ROM, decreased strength, hypomobility, impaired flexibility, and pain.   ACTIVITY LIMITATIONS: standing, squatting, stairs, transfers, locomotion level, and caring for others  PARTICIPATION LIMITATIONS: cleaning, community activity, occupation, and recreational/workout activities  PERSONAL FACTORS:   REHAB POTENTIAL: Good  CLINICAL DECISION MAKING: Stable/uncomplicated  EVALUATION COMPLEXITY: Low   GOALS:   SHORT TERM GOALS: Target date: 12/07/2022  Pt will be  independent and compliant with HEP for improved pain, ROM, strength, and function.  Baseline: Goal status: MET 6/20   2.  Pt will demo improve L knee flexion AROM to at least 110 deg for improved stiffness and mobility.  Baseline:  Goal status: INITIAL  3.  Pt will ambulate with a normalized heel to toe gait pattern without limping.  Baseline:  Goal status: INITIAL Target date:  12/14/2022    LONG TERM GOALS: Target date: 01/11/2023  Pt will demo L knee flexion AROM to at > than 120 deg for improved mobility and stiffness.  Baseline:  Goal status: INITIAL Target date:  12/28/2022   2.  Pt will ambulate extended community distance without difficulty and increased pain.  Baseline:  Goal status: INITIAL  3.  Pt will be able to take care of his kids without significant limitations and significant pain.  Baseline:  Goal status: INITIAL  4.  Pt will score 0-1 on the lateral step down test on a 6 inch step for improved eccentric quad control and performance of stairs.  Baseline:  Goal status: INITIAL Target date:  12/28/2022  5.  Pt will perform stairs with a reciprocal gait with good control.  Baseline:  Goal status: INITIAL  6.  Pt will demo at least 4+/5 L hip strength in flex, abd, add, and ext and at least 4/5 quad strength for improved performance of and tolerance with functional mobility skills.  Baseline:  Goal status: INITIAL   PLAN:  PT FREQUENCY: 1x/week  PT DURATION: other: 6-8 weeks     PLANNED INTERVENTIONS: Therapeutic exercises, Therapeutic activity, Neuromuscular re-education, Balance training, Gait training, Patient/Family education, Self Care, Joint mobilization, Stair training, Aquatic Therapy, Dry Needling, Electrical stimulation, Cryotherapy, Moist heat, scar mobilization, Taping, Ultrasound, Manual therapy, and Re-evaluation  PLAN FOR NEXT SESSION: Cont per Dr. Serena Croissant meniscectomy protocol.  Gait training and ice.     Riki Altes,  PTA  11/22/22 1:51 PM

## 2022-11-23 ENCOUNTER — Encounter (HOSPITAL_BASED_OUTPATIENT_CLINIC_OR_DEPARTMENT_OTHER): Payer: 59

## 2022-11-29 ENCOUNTER — Ambulatory Visit (HOSPITAL_BASED_OUTPATIENT_CLINIC_OR_DEPARTMENT_OTHER): Payer: 59

## 2022-11-29 ENCOUNTER — Encounter (HOSPITAL_BASED_OUTPATIENT_CLINIC_OR_DEPARTMENT_OTHER): Payer: Self-pay

## 2022-11-29 ENCOUNTER — Ambulatory Visit (INDEPENDENT_AMBULATORY_CARE_PROVIDER_SITE_OTHER): Payer: 59 | Admitting: Orthopaedic Surgery

## 2022-11-29 DIAGNOSIS — M25562 Pain in left knee: Secondary | ICD-10-CM

## 2022-11-29 DIAGNOSIS — M6281 Muscle weakness (generalized): Secondary | ICD-10-CM

## 2022-11-29 DIAGNOSIS — M25662 Stiffness of left knee, not elsewhere classified: Secondary | ICD-10-CM | POA: Diagnosis not present

## 2022-11-29 DIAGNOSIS — R262 Difficulty in walking, not elsewhere classified: Secondary | ICD-10-CM

## 2022-11-29 DIAGNOSIS — S83232A Complex tear of medial meniscus, current injury, left knee, initial encounter: Secondary | ICD-10-CM | POA: Diagnosis not present

## 2022-11-29 NOTE — Therapy (Addendum)
 OUTPATIENT PHYSICAL THERAPY LOWER EXTREMITY TREATMENT   Patient Name: Frank Young MRN: [id number] DOB:[birth year], 37 y.o., male Today's Date: 11/29/2022  END OF SESSION:  PT End of Session - 11/29/22 0837     Visit Number 3    Number of Visits 8    Date for PT Re-Evaluation 01/11/23    Authorization Type Gretta Began    PT Start Time [phone]    PT Stop Time 0913    PT Time Calculation (min) 22 min    Activity Tolerance Patient tolerated treatment well    Behavior During Therapy [facility] for tasks assessed/performed               Past Medical History:  Diagnosis Date   Allergy    claritin prn   Anxiety    sometimes gets concerned about medical issues. stress with work.    Chronic tension headaches    improved with change in pillow- monitoring to see if will still occur   Depression    [employer]   GERD (gastroesophageal reflux disease)    prilosec 20mg  since weight gain- wants to be closer to 190 then trial off.    Lichen sclerosus 2020   Past Surgical History:  Procedure Laterality Date   69 HOUR PH STUDY N/A 06/01/2020   Procedure: 24 HOUR PH STUDY;  Surgeon: Shellia Cleverly, DO;  Location: [facility];  Service: Gastroenterology;  Laterality: N/A;   CIRCUMCISION  08/2018   ESOPHAGEAL MANOMETRY N/A 06/01/2020   Procedure: ESOPHAGEAL MANOMETRY (EM);  Surgeon: Shellia Cleverly, DO;  Location: [facility];  Service: Gastroenterology;  Laterality: N/A;   ESOPHAGOGASTRODUODENOSCOPY (EGD) WITH PROPOFOL N/A 10/09/2022   Procedure: ESOPHAGOGASTRODUODENOSCOPY (EGD) WITH PROPOFOL;  Surgeon: Regis Bill, MD;  Location: [facility];  Service: Endoscopy;  Laterality: N/A;   KNEE ARTHROSCOPY WITH MEDIAL MENISECTOMY Left 11/13/2022   Procedure: LEFT KNEE ARTHROSCOPY WITH PARTIAL MEDIAL MENISCECTOMY;  Surgeon: Huel Cote, MD;  Location: [facility];  Service: Orthopedics;  Laterality: Left;   UPPER GASTROINTESTINAL ENDOSCOPY  01/07/2020    WISDOM TOOTH EXTRACTION Bilateral 2002   Patient Active Problem List   Diagnosis Date Noted   Complex tear of medial meniscus of left knee as current injury 11/13/2022   Acute left-sided low back pain with left-sided sciatica 10/04/2021   Cubital tunnel syndrome, left 04/13/2021   Anxiety    GERD (gastroesophageal reflux disease)    Chronic tension headaches    Allergic rhinitis      REFERRING PROVIDER: Huel Cote, MD  REFERRING DIAG: S83.232A (ICD-10-CM) - Complex tear of medial meniscus of left knee as current injury, initial encounter  THERAPY DIAG:  Stiffness of left knee, not elsewhere classified  Left knee pain, unspecified chronicity  Muscle weakness (generalized)  Difficulty in walking, not elsewhere classified  Rationale for Evaluation and Treatment: Rehabilitation  ONSET DATE: DOS 11/13/2022  SUBJECTIVE:   SUBJECTIVE STATEMENT: Pt reports no pain at entry. "I feel almost back to normal." Has some tightness when going down the stairs, but no pain.  PERTINENT HISTORY: L knee arthroscopy with medial meniscectomy on 11/13/22  PAIN:  Are you having pain? No NPRS: 0/10 Location:  L knee  PRECAUTIONS: per surgical protocol  WEIGHT BEARING RESTRICTIONS: Yes WBAT  FALLS:  Has patient fallen in last 6 months? 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  Pt was playing golf.  Pt was able to perform home workouts for upper and lower body.    PATIENT GOALS: return to playing golf and lifting weights.  Return to PLOF  NEXT MD VISIT: 11/29/2022  OBJECTIVE:   DIAGNOSTIC FINDINGS: Pt is post op.  He had an x ray and MRI prior to surgery.  PATIENT SURVEYS:  FOTO 47 with a goal of 76 at visit #14  COGNITION: Overall cognitive status: Within functional limits for tasks  assessed     OBSERVATION: PT removed ACE bandage and post op dressings.  Pt's portals were intact with stitches and had xeroform gauze over them.  Pt had minimal drainage, nothing abnormal.  Pt had no signs of infection.  PT applied new gauze and tegaderm over portals.  PT educated pt concerning dressings and pt verbalizes understanding.  PT reapplied ACE bandage before pt left.     LOWER EXTREMITY ROM:  ROM Right eval Left eval L 6/20 L/R  Hip flexion      Hip extension      Hip abduction      Hip adduction      Hip internal rotation      Hip external rotation      Knee flexion 147 74 deg PROM 129deg AAROM 138/144 AROM  Knee extension 7 deg of hyperextensio 3 deg of hyperextension at rest  0deg  Ankle dorsiflexion      Ankle plantarflexion      Ankle inversion      Ankle eversion       (Blank rows = not tested)  Strength not formally assessed due to healing constraints and surgical protocol.  Pt able to perform supine SLR without extensor lag.   GAIT: Assistive device utilized: None Level of assistance: Complete Independence Comments: Pt ambulated without an AD without increased pain with good stability.  Pt is very limited with knee flexion with gait and maintains his knee fairly straight.  Pt is limited with toe off on L.    TODAY'S TREATMENT:                                                                                                                                 -PROM L knee x80min -Sci-fit Bike full revs -  SLR on elbows 2x15 -single leg bridge 2x10 5s hold -Step up with march- 6" step with airex- x20 Eccentric step down from 6" step x20   PATIENT EDUCATION:  Education details:  Post op and protocol limitations, expectations, and restrictions, Wb'ing orders per MD, dx, prognosis, relevant anatomy, HEP, dressings, and POC.  Instructed pt in using ice.  PT instructed pt in using AD or decreasing weight on L LE if he has increased pain or feels that his  knee wants to give way.   Person educated: Patient Education method: Explanation, Demonstration, Tactile cues, Verbal cues, and Handouts Education comprehension: verbalized understanding, returned demonstration,  verbal cues required, and tactile cues required  HOME EXERCISE PROGRAM: Access Code: URL: [email or web address] Date: 11/16/2022 Prepared by: Aaron Edelman  Exercises - Supine Quadricep Sets  - 2 x daily - 7 x weekly - 2 sets - 10 reps - 5 seconds hold - ANKLE PUMPS  - 3-4 x daily - 7 x weekly - 2-3 sets - 10 reps - Supine Active Straight Leg Raise  - 1 x daily - 6-7 x weekly - 2 sets - 10 reps - Sitting knee flexion AAROM with Hands under Thigh  - 2-3 x daily - 7 x weekly - 1 sets - 10 reps  ASSESSMENT:  CLINICAL IMPRESSION: Pt continues with excellent progress. ROM nearly equal to contralateral limb. Good control with eccentric heel taps from 6" step. Able to demonstrate single leg balance and stability in clinic. Pt has met most goals. Session was shortened due to MD appt for suture removal today. MD may d/c pt from PT.    OBJECTIVE IMPAIRMENTS: Abnormal gait, decreased activity tolerance, decreased endurance, decreased mobility, difficulty walking, decreased ROM, decreased strength, hypomobility, impaired flexibility, and pain.   ACTIVITY LIMITATIONS: standing, squatting, stairs, transfers, locomotion level, and caring for others  PARTICIPATION LIMITATIONS: cleaning, community activity, occupation, and recreational/workout activities  PERSONAL FACTORS:   REHAB POTENTIAL: Good  CLINICAL DECISION MAKING: Stable/uncomplicated  EVALUATION COMPLEXITY: Low   GOALS:   SHORT TERM GOALS: Target date: 12/07/2022  Pt will be independent and compliant with HEP for improved pain, ROM, strength, and function.  Baseline: Goal status: MET 6/20   2.  Pt will demo improve L knee flexion AROM to at least 110 deg for improved stiffness and mobility.   Baseline:  Goal status: MET 6/20  3.  Pt will ambulate with a normalized heel to toe gait pattern without limping.  Baseline:  Goal status: MET 6/27 Target date:  12/14/2022    LONG TERM GOALS: Target date: 01/11/2023  Pt will demo L knee flexion AROM to at > than 120 deg for improved mobility and stiffness.  Baseline:  Goal status: MET 6/20 Target date:  12/28/2022   2.  Pt will ambulate extended community distance without difficulty and increased pain.  Baseline:  Goal status: MET 6/27 (walked around [employer])  3.  Pt will be able to take care of his kids without significant limitations and significant pain.  Baseline:  Goal status: MET 6/27  4.  Pt will score 0-1 on the lateral step down test on a 6 inch step for improved eccentric quad control and performance of stairs.  Baseline:  Goal status: IN PROGRESS Target date:  12/28/2022  5.  Pt will perform stairs with a reciprocal gait with good control.  Baseline:  Goal status: IN PROGRESS 6/27  6.  Pt will demo at least 4+/5 L hip strength in flex, abd, add, and ext and at least 4/5 quad strength for improved performance of and tolerance with functional mobility skills.  Baseline:  Goal status: INITIAL   PLAN:  PT FREQUENCY: 1x/week  PT DURATION: other: 6-8 weeks     PLANNED INTERVENTIONS: Therapeutic exercises, Therapeutic activity, Neuromuscular re-education, Balance training, Gait training, Patient/Family education, Self Care, Joint mobilization, Stair training, Aquatic Therapy, Dry Needling, Electrical stimulation, Cryotherapy, Moist heat, scar mobilization, Taping, Ultrasound, Manual therapy, and Re-evaluation  PLAN FOR NEXT SESSION: Cont per Dr. Serena Croissant meniscectomy protocol.  Gait training and ice.     Riki Altes, PTA  11/29/22 1:12 PM  PHYSICAL THERAPY DISCHARGE SUMMARY  Visits from Start of Care: 3  Current functional level related to goals / functional outcomes: Unable to assess at discharge due to  pt not being present.  Pt was making excellent progress and had met most goals at last visit.  See above.     Remaining deficits:    Education / Equipment: See above   Patient was seen in PT from 11/16/22 to 11/29/22.  Pt canceled his remaining appointments.  Pt stated he was feeling better and he was advised by MD that he didn't need any further PT.  Pt will be considered discharged from PT at this time.   Audie Clear III PT, DPT 08/03/23 10:08 PM

## 2022-11-29 NOTE — Progress Notes (Signed)
Post Operative Evaluation    Procedure/Date of Surgery: Left medial meniscal debridement 6/11  Interval History:   Presents today for follow-up 2 weeks status post left knee medial meniscal debridement overall doing extremely well.  He does have a little bit of tightness going downstairs but otherwise feels essentially back to normal.  He has been able to return to work without issues   PMH/PSH/Family History/Social History/Meds/Allergies:    Past Medical History:  Diagnosis Date   Allergy    claritin prn   Anxiety    sometimes gets concerned about medical issues. stress with work.    Chronic tension headaches    improved with change in pillow- monitoring to see if will still occur   Depression    [employer]   GERD (gastroesophageal reflux disease)    prilosec 20mg  since weight gain- wants to be closer to 190 then trial off.    Lichen sclerosus 2020   Past Surgical History:  Procedure Laterality Date   13 HOUR PH STUDY N/A 06/01/2020   Procedure: 24 HOUR PH STUDY;  Surgeon: Shellia Cleverly, DO;  Location: [facility];  Service: Gastroenterology;  Laterality: N/A;   CIRCUMCISION  08/2018   ESOPHAGEAL MANOMETRY N/A 06/01/2020   Procedure: ESOPHAGEAL MANOMETRY (EM);  Surgeon: Shellia Cleverly, DO;  Location: [facility];  Service: Gastroenterology;  Laterality: N/A;   ESOPHAGOGASTRODUODENOSCOPY (EGD) WITH PROPOFOL N/A 10/09/2022   Procedure: ESOPHAGOGASTRODUODENOSCOPY (EGD) WITH PROPOFOL;  Surgeon: Regis Bill, MD;  Location: [facility];  Service: Endoscopy;  Laterality: N/A;   KNEE ARTHROSCOPY WITH MEDIAL MENISECTOMY Left 11/13/2022   Procedure: LEFT KNEE ARTHROSCOPY WITH PARTIAL MEDIAL MENISCECTOMY;  Surgeon: Huel Cote, MD;  Location: [facility];  Service: Orthopedics;  Laterality: Left;   UPPER GASTROINTESTINAL ENDOSCOPY  01/07/2020   WISDOM TOOTH EXTRACTION Bilateral 2002   Social History   Socioeconomic  History   Marital status: Married    Spouse name: Not on file   Number of children: 1   Years of education: Not on file   Highest education level: Not on file  Occupational History   Occupation: physician  Tobacco Use   Smoking status: Never   Smokeless tobacco: Never  Vaping Use   Vaping Use: Never used  Substance and Sexual Activity   Alcohol use: Yes    Alcohol/week: 1.0 - 2.0 standard drink of alcohol    Types: 1 - 2 Cans of beer per week    Comment: weekly   Drug use: No   Sexual activity: Yes  Other Topics Concern   Not on file  Social History Narrative   Married. 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      Enjoys golfing   Social Determinants of Health   Financial Resource Strain: Not on file  Food Insecurity: Not on file  Transportation Needs: Not on file  Physical Activity: Not on file  Stress: Not on file  Social Connections: Not on file   Family History  Problem Relation Age of Onset   Healthy Mother    Cancer Father        GIST   Lung cancer Maternal Grandmother        smoker  Diabetes Maternal Grandfather    Stroke Maternal Grandfather    Skin cancer Paternal Grandmother    Arthritis/Rheumatoid Paternal Grandmother    Skin cancer Paternal Grandfather    Heart disease Paternal Grandfather        CABG in 67s   Colon cancer Paternal Grandfather    Testicular cancer Paternal Uncle    Hypertension Paternal Uncle    Hyperlipidemia Paternal Uncle    Esophageal cancer Neg Hx    Rectal cancer Neg Hx    Stomach cancer Neg Hx    No Known Allergies Current Outpatient Medications  Medication Sig Dispense Refill   aspirin EC 325 MG tablet Take 1 tablet (325 mg total) by mouth daily. 14 tablet 0   augmented betamethasone dipropionate (DIPROLENE-AF) 0.05 % ointment Apply topically.     augmented betamethasone dipropionate (DIPROLENE-AF) 0.05 % ointment  Apply topically once as needed. 30 g 1   azelastine (ASTELIN) 0.1 % nasal spray PLACE 1-2 SPRAYS IN EACH NOSTRIL TWICE DAILY 30 mL 12   azelastine (ASTELIN) 0.1 % nasal spray Place 1 spray into both nostrils 2 (two) times daily. Use in each nostril as directed 90 mL 3   B Complex Vitamins (B-COMPLEX/B-12 PO) Take 1,000 mcg by mouth daily.     Dexlansoprazole 30 MG capsule DR Take 1 capsule (30 mg total) by mouth daily. 30 capsule 11   fexofenadine (ALLEGRA) 180 MG tablet Take 180 mg by mouth daily.     fluticasone (FLONASE) 50 MCG/ACT nasal spray Place 1 spray into both nostrils daily as needed for allergies or rhinitis. 16 g 3   Meth-Hyo-M Bl-Na Phos-Ph Sal (URIBEL) 118 MG CAPS Take 1 capsule by mouth Three (3) times a day as needed (burning with urination). 21 capsule 0   oxyCODONE (OXY IR/ROXICODONE) 5 MG immediate release tablet Take 1 tablet (5 mg total) by mouth every four (4) hours as needed for pain. (Patient not taking: Reported on 09/25/2022) 6 tablet 0   oxyCODONE (OXY IR/ROXICODONE) 5 MG immediate release tablet Take 1 tablet (5 mg total) by mouth every 4 (four) hours as needed (severe pain). 5 tablet 0   Triamcinolone Acetonide (NASACORT ALLERGY 24HR NA) Place into the nose.     No current facility-administered medications for this visit.   No results found.  Review of Systems:   A ROS was performed including pertinent positives and negatives as documented in the HPI.   Musculoskeletal Exam:    There were no vitals taken for this visit.  Left knee incisions are well-appearing without erythema or drainage.  Range of motion is from 0-130.  No swelling.  Mild quad atrophy  Imaging:      I personally reviewed and interpreted the radiographs.   Assessment:   2 weeks status post left knee medial meniscal debridement overall doing extremely well.  At this time he will return to full activity.  I will plan to see him back as needed  Plan :    -Return to clinic as  needed      I personally saw and evaluated the patient, and participated in the management and treatment plan.  Huel Cote, MD Attending Physician, Orthopedic Surgery  This document was dictated using Dragon voice recognition software. A reasonable attempt at proof reading has been made to minimize errors.

## 2022-11-30 ENCOUNTER — Encounter (HOSPITAL_BASED_OUTPATIENT_CLINIC_OR_DEPARTMENT_OTHER): Payer: 59 | Admitting: Physical Therapy

## 2022-12-04 ENCOUNTER — Encounter (HOSPITAL_BASED_OUTPATIENT_CLINIC_OR_DEPARTMENT_OTHER): Payer: 59 | Admitting: Physical Therapy

## 2022-12-05 ENCOUNTER — Ambulatory Visit: Payer: 59 | Admitting: Family Medicine

## 2022-12-07 ENCOUNTER — Encounter (HOSPITAL_BASED_OUTPATIENT_CLINIC_OR_DEPARTMENT_OTHER): Payer: 59 | Admitting: Physical Therapy

## 2022-12-11 ENCOUNTER — Encounter (HOSPITAL_BASED_OUTPATIENT_CLINIC_OR_DEPARTMENT_OTHER): Payer: 59

## 2022-12-11 ENCOUNTER — Ambulatory Visit (INDEPENDENT_AMBULATORY_CARE_PROVIDER_SITE_OTHER): Payer: 59 | Admitting: Family Medicine

## 2022-12-11 VITALS — BP 152/94 | HR 86 | Ht 75.0 in | Wt 199.0 lb

## 2022-12-11 DIAGNOSIS — M5416 Radiculopathy, lumbar region: Secondary | ICD-10-CM

## 2022-12-11 NOTE — Progress Notes (Signed)
Rubin Payor, PhD, LAT, ATC acting as a scribe for Frank Graham, MD.  Frank Young is a 37 y.o. male who presents to [facility] today for elbow pain. Pt was last seen by Dr. [name] on 09/25/22 for L knee pain resulting in an arthroscopy w/ partial medial menisectomy performed by Dr. Steward Drone on June 11th.  Today, pt c/o worsening low back pain. As he was starting post-op PT, his back pain has been exacerbated. Pt locates pain to the L-side of his low back w/ numbness/tinging in the lateral aspect of his L foot as well as dorsum and plantar aspect. And possible S2.   L elbow pain has improved over the last couple days. He has been wearing the cubital tunnel brace at night.   Pertinent review of systems: No fevers or chills  Relevant historical information: Meniscus surgery recently   Exam:  BP (!) 152/94   Pulse 86   Ht 6\' 3"  (1.905 m)   Wt 199 lb (90.3 kg)   SpO2 97%   BMI 24.87 kg/m  General: Well Developed, well nourished, and in no acute distress.   MSK: L-spine: Normal appearing. Nontender palpation spinal midline. Normal lumbar motion. Lower extremity strength is intact. Reflexes are intact. Positive left-sided slump test.   Lab and Radiology Results  EXAM: MRI LUMBAR SPINE WITHOUT CONTRAST   TECHNIQUE: Multiplanar, multisequence MR imaging of the lumbar spine was performed. No intravenous contrast was administered.   COMPARISON:  Lumbar spine radiograph 10/03/2021   FINDINGS: Segmentation:  Standard.   Alignment:  Physiologic.   Vertebrae: No fracture, evidence of discitis, or aggressive bone lesion.   Conus medullaris and cauda equina: Conus extends to the L2 level. Conus and cauda equina appear normal.   Paraspinal and other soft tissues: Tiny perineural cyst on the left at S2.   Disc levels:   T12-L1: Mild disc desiccation.  No significant stenosis.   L1-L2: Mild disc desiccation.  No significant  stenosis.   L2-L3: Mild disc desiccation.  No significant stenosis.   L3-L4: Prominent disc desiccation with asymmetric left disc bulging results in mild narrowing of the lateral recesses. There is encroachment of the descending left L4 nerve root without definite impingement. Mild left neural foraminal narrowing.   L4-L5: Disc desiccation with minimal disc bulging. Mild bilateral facet arthropathy. No significant stenosis.   L5-S1: Disc desiccation with minimal disc bulging. Mild bilateral facet arthropathy. No significant stenosis.   IMPRESSION: Mild multilevel degenerative changes of the lumbar spine, most prominent at L3-L4 with asymmetric left disc bulging resulting in mild narrowing of the lateral recesses, left greater than right, and with encroachment of the descending left L4 nerve root. Mild left-sided neural foraminal narrowing at this level.   Mild bilateral facet arthropathy at L4-L5 and L5-S1.     Electronically Signed   By: Caprice Renshaw M.D.   On: 02/22/2022 09:58 I, Frank Young, personally (independently) visualized and performed the interpretation of the images attached in this note.   Assessment and Plan: 37 y.o. male with left-sided low back pain with pain radiating down the left leg in an L5 or S1 dermatomal pattern.  Plan for repeat epidural steroid injection based on MRI from September 2023.  This is not sufficient especially for the back pain would consider left L5-S1 and L4-L5 facet joint injections.  Consider retrial of physical therapy in the future if needed as well.  He will keep me updated with how he  feels.   PDMP not reviewed this encounter. Orders Placed This Encounter  Procedures   DG INJECT DIAG/THERA/INC NEEDLE/CATH/PLC EPI/LUMB/SAC W/IMG    Standing Status:   Future    Standing Expiration Date:   12/11/2023    Order Specific Question:   Reason for Exam (SYMPTOM  OR DIAGNOSIS REQUIRED)    Answer:   Repeat ESI or NRB or both. Left Lumbar  radicular symptoms. Level and technique per radiology    Order Specific Question:   Preferred Imaging Location?    Answer:   [id number]    Order Specific Question:   Radiology Contrast Protocol - do NOT remove file path    Answer:   [email or web address]   No orders of the defined types were placed in this encounter.    Discussed warning signs or symptoms. Please see discharge instructions. Patient expresses understanding.   The above documentation has been reviewed and is accurate and complete Frank Young, M.D.

## 2022-12-11 NOTE — Patient Instructions (Addendum)
Thank you for coming in today.   Please call [facility] at [phone] to schedule your spine injection.    If this is not working we could try a facet joint injection.   We could try a repeat MRI if needed.

## 2022-12-13 ENCOUNTER — Other Ambulatory Visit: Payer: Self-pay

## 2022-12-13 ENCOUNTER — Encounter (HOSPITAL_BASED_OUTPATIENT_CLINIC_OR_DEPARTMENT_OTHER): Payer: 59 | Admitting: Physical Therapy

## 2022-12-14 ENCOUNTER — Encounter (HOSPITAL_BASED_OUTPATIENT_CLINIC_OR_DEPARTMENT_OTHER): Payer: 59 | Admitting: Physical Therapy

## 2022-12-14 ENCOUNTER — Other Ambulatory Visit: Payer: Self-pay

## 2022-12-18 ENCOUNTER — Encounter (HOSPITAL_BASED_OUTPATIENT_CLINIC_OR_DEPARTMENT_OTHER): Payer: 59

## 2022-12-20 ENCOUNTER — Encounter (HOSPITAL_BASED_OUTPATIENT_CLINIC_OR_DEPARTMENT_OTHER): Payer: 59 | Admitting: Physical Therapy

## 2022-12-21 ENCOUNTER — Encounter (HOSPITAL_BASED_OUTPATIENT_CLINIC_OR_DEPARTMENT_OTHER): Payer: 59 | Admitting: Physical Therapy

## 2022-12-27 ENCOUNTER — Ambulatory Visit
Admission: RE | Admit: 2022-12-27 | Discharge: 2022-12-27 | Disposition: A | Discharge status: Home / Self Care | Payer: 59 | Source: Ambulatory Visit | Attending: Family Medicine | Admitting: Family Medicine

## 2022-12-27 DIAGNOSIS — M5417 Radiculopathy, lumbosacral region: Secondary | ICD-10-CM | POA: Diagnosis not present

## 2022-12-27 DIAGNOSIS — M47817 Spondylosis without myelopathy or radiculopathy, lumbosacral region: Secondary | ICD-10-CM | POA: Diagnosis not present

## 2022-12-27 DIAGNOSIS — M5416 Radiculopathy, lumbar region: Secondary | ICD-10-CM

## 2022-12-27 MED ORDER — METHYLPREDNISOLONE ACETATE 40 MG/ML INJ SUSP (RADIOLOG
80.0000 mg | Freq: Once | INTRAMUSCULAR | Status: AC
  Administered 2022-12-27: 80 mg via EPIDURAL

## 2022-12-27 MED ORDER — IOPAMIDOL (ISOVUE-M 200) INJECTION 41%
1.0000 mL | Freq: Once | INTRAMUSCULAR | Status: AC
  Administered 2022-12-27: 1 mL via EPIDURAL

## 2022-12-27 NOTE — Discharge Instructions (Signed)

## 2023-01-03 ENCOUNTER — Ambulatory Visit (INDEPENDENT_AMBULATORY_CARE_PROVIDER_SITE_OTHER): Payer: 59 | Admitting: Family Medicine

## 2023-01-03 ENCOUNTER — Encounter: Payer: Self-pay | Admitting: Family Medicine

## 2023-01-03 VITALS — BP 118/82 | HR 76 | Temp 98.0°F | Ht 75.0 in | Wt 194.8 lb

## 2023-01-03 DIAGNOSIS — Z79899 Other long term (current) drug therapy: Secondary | ICD-10-CM

## 2023-01-03 DIAGNOSIS — Z13220 Encounter for screening for lipoid disorders: Secondary | ICD-10-CM | POA: Diagnosis not present

## 2023-01-03 DIAGNOSIS — Z130 Encounter for screening for diseases of the blood and blood-forming organs and certain disorders involving the immune mechanism: Secondary | ICD-10-CM

## 2023-01-03 DIAGNOSIS — Z1321 Encounter for screening for nutritional disorder: Secondary | ICD-10-CM | POA: Diagnosis not present

## 2023-01-03 DIAGNOSIS — Z0000 Encounter for general adult medical examination without abnormal findings: Secondary | ICD-10-CM | POA: Diagnosis not present

## 2023-01-03 DIAGNOSIS — E538 Deficiency of other specified B group vitamins: Secondary | ICD-10-CM | POA: Diagnosis not present

## 2023-01-03 NOTE — Patient Instructions (Addendum)
Get labs done at your clinic  Go back for confirmation after vasectomy  Recommended follow up: Return in about 1 year (around 01/03/2024) for physical or sooner if needed.Schedule b4 you leave.

## 2023-01-03 NOTE — Progress Notes (Signed)
Phone: [phone]    Subjective:  Patient presents today for their annual physical. Chief complaint-noted.   See problem oriented charting- ROS- full  review of systems was completed and negative  Per full ROS sheet completed by patient  The following were reviewed and entered/updated in epic: Past Medical History:  Diagnosis Date   Allergy    claritin prn   Anxiety    sometimes gets concerned about medical issues. stress with work.    Chronic tension headaches    improved with change in pillow- monitoring to see if will still occur   Depression    College   GERD (gastroesophageal reflux disease)    prilosec 20mg  since weight gain- wants to be closer to 190 then trial off.    Lichen sclerosus 2020   Patient Active Problem List   Diagnosis Date Noted   Anxiety     Priority: Medium    GERD (gastroesophageal reflux disease)     Priority: Medium    Chronic tension headaches     Priority: Medium    Acute left-sided low back pain with left-sided sciatica 10/04/2021    Priority: Low   Cubital tunnel syndrome, left 04/13/2021    Priority: Low   Allergic rhinitis     Priority: Low   Complex tear of medial meniscus of left knee as current injury 11/13/2022   Past Surgical History:  Procedure Laterality Date   24 HOUR PH STUDY N/A 06/01/2020   Procedure: 24 HOUR PH STUDY;  Surgeon: Shellia Cleverly, DO;  Location: [facility];  Service: Gastroenterology;  Laterality: N/A;   CIRCUMCISION  08/2018   ESOPHAGEAL MANOMETRY N/A 06/01/2020   Procedure: ESOPHAGEAL MANOMETRY (EM);  Surgeon: Shellia Cleverly, DO;  Location: [facility];  Service: Gastroenterology;  Laterality: N/A;   ESOPHAGOGASTRODUODENOSCOPY (EGD) WITH PROPOFOL N/A 10/09/2022   Procedure: ESOPHAGOGASTRODUODENOSCOPY (EGD) WITH PROPOFOL;  Surgeon: Regis Bill, MD;  Location: [facility];  Service: Endoscopy;  Laterality: N/A;   KNEE ARTHROSCOPY WITH MEDIAL MENISECTOMY Left 11/13/2022   Procedure:  LEFT KNEE ARTHROSCOPY WITH PARTIAL MEDIAL MENISCECTOMY;  Surgeon: Huel Cote, MD;  Location: [facility];  Service: Orthopedics;  Laterality: Left;   UPPER GASTROINTESTINAL ENDOSCOPY  01/07/2020   VASECTOMY     WISDOM TOOTH EXTRACTION Bilateral 2002    Family History  Problem Relation Age of Onset   Healthy Mother    Cancer Father        GIST   Lung cancer Maternal Grandmother        smoker   Diabetes Maternal Grandfather    Stroke Maternal Grandfather    Skin cancer Paternal Grandmother    Arthritis/Rheumatoid Paternal Grandmother    Skin cancer Paternal Grandfather    Heart disease Paternal Grandfather        CABG in 64s   Colon cancer Paternal Grandfather    Testicular cancer Paternal Uncle    Hypertension Paternal Uncle    Hyperlipidemia Paternal Uncle    Esophageal cancer Neg Hx    Rectal cancer Neg Hx    Stomach cancer Neg Hx     Medications- reviewed and updated Current Outpatient Medications  Medication Sig Dispense Refill   augmented betamethasone dipropionate (DIPROLENE-AF) 0.05 % ointment Apply topically once as needed. 30 g 1   azelastine (ASTELIN) 0.1 % nasal spray Place 1 spray into both nostrils 2 (two) times daily. Use in each nostril as directed 90 mL 3   B Complex Vitamins (B-COMPLEX/B-12 PO) Take 1,000 mcg by  mouth daily.     Dexlansoprazole 30 MG capsule DR Take 1 capsule (30 mg total) by mouth daily. 30 capsule 11   fexofenadine (ALLEGRA) 180 MG tablet Take 180 mg by mouth daily.     fluticasone (FLONASE) 50 MCG/ACT nasal spray Place 1 spray into both nostrils daily as needed for allergies or rhinitis. 16 g 3   Meth-Hyo-M Bl-Na Phos-Ph Sal (URIBEL) 118 MG CAPS Take 1 capsule by mouth Three (3) times a day as needed (burning with urination). 21 capsule 0   No current facility-administered medications for this visit.    Allergies-reviewed and updated No Known Allergies  Social History   Social History Narrative   Married. 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 Oropharynx normal Neck: no thyromegaly CV: RRR no murmurs rubs or gallops Lungs: CTAB no crackles, wheeze, rhonchi Abdomen: soft/nontender/nondistended/normal bowel sounds. No rebound or guarding.  Ext: no edema Skin: warm, dry Neuro: grossly normal, moves all extremities, PERRLA    Assessment and Plan:  37 y.o. male presenting for annual physical.  Health Maintenance counseling: 1. Anticipatory guidance: Patient counseled regarding regular dental exams -q6 months, eye exams - yearly,  avoiding smoking and second hand smoke , limiting alcohol to 2 beverages per day- variable 0-3 a week, no illicit drugs.   2. Risk factor reduction:  Advised patient of need for regular exercise and diet rich and fruits and vegetables to reduce risk of heart attack and stroke.  Exercise- limited by back and knee recently.  Diet/weight management-stable weight from last year-healthy weight- has lost muscle mass though- wants to work on this as he heals.  Wt Readings from Last 3 Encounters:  01/03/23 194 lb 12.8 oz (88.4 kg)  12/11/22 199 lb (90.3 kg)  11/13/22 194 lb 14.2 oz (88.4 kg)  3. Immunizations/screenings/ancillary studies-she will let us know when he gets his full flu shot-opting out of COVID-19 vaccination - reflux worsened after COVID and COVID shot Immunization History  Administered Date(s) Administered   Influenza Inj Mdck Quad Pf 03/27/2022   Influenza,inj,Quad PF,6+ Mos 03/18/2021   Influenza-Unspecified 03/15/2016, 03/14/2018   PFIZER(Purple Top)SARS-COV-2 Vaccination 06/22/2019, 07/10/2019   Tdap  01/31/2017  4. Prostate cancer screening- prior PSA was from Dr. Tyson Alias labs-no family history of prostate cancer so we will start screening at 28 Lab Results  Component Value Date   PSA 0.3 09/06/2017  5. Colon cancer screening - grandfather at age 36 but no early history-plan on starting screening at age 60  6. Skin cancer screening/prevention- 3 years ago for eyelid but no recent issues . advised regular sunscreen use. Denies worrisome, changing, or new skin lesions.  7. Testicular cancer screening- advised monthly self exams   8. STD screening- patient opts out as monogamous.  vasectomy after 2nd child 9. Smoking associated screening-never smoke  Status of chronic or acute concerns   # Left knee surgery-November 13, 2022 with Dr. Billey Chang with partial meniscectomy due to meniscus tear- has done well  #  GERD S:Medication: dexilant 30 mg now-  working much better -H. 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  Astepro taste was off.  Also takes Allegra  # Left low back pain with radiculopathy-has worked with Dr. 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  Lab/Order associations:will do fasting labs later   ICD-10-CM   1. Routine general medical examination at a health care facility  Z00.00     2. High risk medication use  Z79.899     3. Preventative health care  Z00.00     4. Screening for hyperlipidemia  Z13.220 Comprehensive metabolic panel    Lipid panel    5. Screening, anemia, deficiency, iron  Z13.0 CBC with Differential/Platelet    6. B12 deficiency  E53.8 Vitamin B12    7. Encounter for vitamin deficiency screening  Z13.21 VITAMIN D 25 Hydroxy (Vit-D Deficiency, Fractures)     No orders of the defined types were placed in this encounter.  Return precautions advised.  Tana Conch, MD

## 2023-01-11 ENCOUNTER — Telehealth: Payer: 59 | Admitting: Nurse Practitioner

## 2023-01-11 DIAGNOSIS — J0100 Acute maxillary sinusitis, unspecified: Secondary | ICD-10-CM

## 2023-01-11 MED ORDER — AMOXICILLIN-POT CLAVULANATE 875-125 MG PO TABS
1.0000 | ORAL_TABLET | Freq: Two times a day (BID) | ORAL | 0 refills | Status: AC
Start: 2023-01-11 — End: 2023-01-18

## 2023-01-11 NOTE — Progress Notes (Signed)
E-Visit for Sinus Problems  We are sorry that you are not feeling well.  Here is how we plan to help!  Based on what you have shared with me it looks like you have sinusitis.  Sinusitis is inflammation and infection in the sinus cavities of the head.  Based on your presentation I believe you most likely have Acute Bacterial Sinusitis.  This is an infection caused by bacteria and is treated with antibiotics. I have prescribed Augmentin 875mg /125mg  one tablet twice daily with food, for 7 days. You may use an oral decongestant such as Mucinex to help with your cough as well. Continue you allergy regimen, and add saline nasal spray to help as well.  If you develop worsening sinus pain, fever or notice severe headache and vision changes, or if symptoms are not better after completion of antibiotic, please schedule an appointment with a health care provider.    Sinus infections are not as easily transmitted as other respiratory infection, however we still recommend that you avoid close contact with loved ones, especially the very young and elderly.  Remember to wash your hands thoroughly throughout the day as this is the number one way to prevent the spread of infection!  Home Care: Only take medications as instructed by your medical team. Complete the entire course of an antibiotic. Do not take these medications with alcohol. A steam or ultrasonic humidifier can help congestion.  You can place a towel over your head and breathe in the steam from hot water coming from a faucet. Avoid close contacts especially the very young and the elderly. Cover your mouth when you cough or sneeze. Always remember to wash your hands.  Get Help Right Away If: You develop worsening fever or sinus pain. You develop a severe head ache or visual changes. Your symptoms persist after you have completed your treatment plan.  Make sure you Understand these instructions. Will watch your condition. Will get help right away  if you are not doing well or get worse.  Thank you for choosing an e-visit.  Your e-visit answers were reviewed by a board certified advanced clinical practitioner to complete your personal care plan. Depending upon the condition, your plan could have included both over the counter or prescription medications.  Please review your pharmacy choice. Make sure the pharmacy is open so you can pick up prescription now. If there is a problem, you may contact your provider through [employer] and have the prescription routed to another pharmacy.  Your safety is important to [place]. If you have drug allergies check your prescription carefully.   For the next 24 hours you can use MyChart to ask questions about today's visit, request a non-urgent call back, or ask for a work or school excuse. You will get an email in the next two days asking about your experience. I hope that your e-visit has been valuable and will speed your recovery.   Meds ordered this encounter  Medications   amoxicillin-clavulanate (AUGMENTIN) 875-125 MG tablet    Sig: Take 1 tablet by mouth 2 (two) times daily for 7 days. Take with food    Dispense:  14 tablet    Refill:  0    I spent approximately 5 minutes reviewing the patient's history, current symptoms and coordinating their care today.

## 2023-01-18 ENCOUNTER — Encounter: Payer: Self-pay | Admitting: Family Medicine

## 2023-01-22 ENCOUNTER — Other Ambulatory Visit: Payer: Self-pay

## 2023-01-22 DIAGNOSIS — M5416 Radiculopathy, lumbar region: Secondary | ICD-10-CM

## 2023-02-11 ENCOUNTER — Other Ambulatory Visit: Payer: Self-pay

## 2023-02-11 DIAGNOSIS — M5416 Radiculopathy, lumbar region: Secondary | ICD-10-CM

## 2023-02-15 ENCOUNTER — Other Ambulatory Visit: Payer: Self-pay

## 2023-02-18 ENCOUNTER — Other Ambulatory Visit: Payer: Self-pay

## 2023-02-19 ENCOUNTER — Encounter: Payer: Self-pay | Admitting: Family Medicine

## 2023-02-19 ENCOUNTER — Ambulatory Visit (INDEPENDENT_AMBULATORY_CARE_PROVIDER_SITE_OTHER): Payer: 59 | Admitting: Family Medicine

## 2023-02-19 VITALS — BP 122/64 | HR 78 | Temp 98.0°F | Ht 75.0 in | Wt 198.4 lb

## 2023-02-19 DIAGNOSIS — R599 Enlarged lymph nodes, unspecified: Secondary | ICD-10-CM

## 2023-02-19 NOTE — Progress Notes (Addendum)
   Frank Young is a 36 y.o. male who presents today for an office visit.  Assessment/Plan:  Enlarged Lymph Node No red flags.  May be related to previous vasectomy.  Given length of symptoms would be reasonable to check ultrasound at this time to further evaluate.  Will place order for limited pelvic ultrasound.  He is aware of reasons to return to care.  He will contact us if he has any change in symptoms.     Subjective:  HPI:  Patient hospital lymph node.  On the right side this morning.  Started 3 weeks ago.  Did have some mild right scrotal discomfort that is since resolved.  No dysuria.  No hematuria.  No penile discharge.  No fevers or chills.  He would like to get an ultrasound to make sure there is nothing more serious going on.  No intentional weight loss.  No night sweats.  No appetite changes.       Objective:  Physical Exam: BP 122/64   Pulse 78   Temp 98 F (36.7 C) (Temporal)   Ht 6\' 3"  (1.905 m)   Wt 198 lb 6.4 oz (90 kg)   SpO2 98%   BMI 24.80 kg/m   Gen: No acute distress, resting comfortably GU: Deferred Neuro: Grossly normal, moves all extremities Psych: Normal affect and thought content      Kayona Foor M. Jimmey Ralph, MD 02/19/2023 1:04 PM

## 2023-03-04 ENCOUNTER — Encounter: Payer: Self-pay | Admitting: Family Medicine

## 2023-03-10 DIAGNOSIS — J029 Acute pharyngitis, unspecified: Secondary | ICD-10-CM | POA: Diagnosis not present

## 2023-03-12 ENCOUNTER — Other Ambulatory Visit: Payer: Self-pay

## 2023-03-16 DIAGNOSIS — R590 Localized enlarged lymph nodes: Secondary | ICD-10-CM | POA: Diagnosis not present

## 2023-03-16 DIAGNOSIS — Z9852 Vasectomy status: Secondary | ICD-10-CM | POA: Diagnosis not present

## 2023-03-16 DIAGNOSIS — K6289 Other specified diseases of anus and rectum: Secondary | ICD-10-CM | POA: Diagnosis not present

## 2023-03-16 DIAGNOSIS — N503 Cyst of epididymis: Secondary | ICD-10-CM | POA: Diagnosis not present

## 2023-03-16 DIAGNOSIS — N452 Orchitis: Secondary | ICD-10-CM | POA: Diagnosis not present

## 2023-03-16 DIAGNOSIS — R1084 Generalized abdominal pain: Secondary | ICD-10-CM | POA: Diagnosis not present

## 2023-03-16 DIAGNOSIS — N50812 Left testicular pain: Secondary | ICD-10-CM | POA: Diagnosis not present

## 2023-03-16 DIAGNOSIS — K219 Gastro-esophageal reflux disease without esophagitis: Secondary | ICD-10-CM | POA: Diagnosis not present

## 2023-03-16 DIAGNOSIS — R0602 Shortness of breath: Secondary | ICD-10-CM | POA: Diagnosis not present

## 2023-03-16 DIAGNOSIS — R1032 Left lower quadrant pain: Secondary | ICD-10-CM | POA: Diagnosis not present

## 2023-03-16 DIAGNOSIS — N50811 Right testicular pain: Secondary | ICD-10-CM | POA: Diagnosis not present

## 2023-03-19 ENCOUNTER — Ambulatory Visit
Admission: RE | Admit: 2023-03-19 | Discharge: 2023-03-19 | Disposition: A | Discharge status: Home / Self Care | Payer: 59 | Source: Ambulatory Visit | Attending: Family Medicine | Admitting: Family Medicine

## 2023-03-19 DIAGNOSIS — Z792 Long term (current) use of antibiotics: Secondary | ICD-10-CM | POA: Diagnosis not present

## 2023-03-19 DIAGNOSIS — R599 Enlarged lymph nodes, unspecified: Secondary | ICD-10-CM | POA: Insufficient documentation

## 2023-03-19 DIAGNOSIS — N452 Orchitis: Secondary | ICD-10-CM | POA: Diagnosis not present

## 2023-03-19 DIAGNOSIS — Z0389 Encounter for observation for other suspected diseases and conditions ruled out: Secondary | ICD-10-CM | POA: Diagnosis not present

## 2023-03-19 DIAGNOSIS — R509 Fever, unspecified: Secondary | ICD-10-CM | POA: Diagnosis not present

## 2023-03-19 DIAGNOSIS — R000 Tachycardia, unspecified: Secondary | ICD-10-CM | POA: Diagnosis not present

## 2023-03-19 DIAGNOSIS — I4510 Unspecified right bundle-branch block: Secondary | ICD-10-CM | POA: Diagnosis not present

## 2023-03-19 DIAGNOSIS — Z20822 Contact with and (suspected) exposure to covid-19: Secondary | ICD-10-CM | POA: Diagnosis not present

## 2023-04-12 DIAGNOSIS — R079 Chest pain, unspecified: Secondary | ICD-10-CM | POA: Diagnosis not present

## 2023-04-17 ENCOUNTER — Encounter: Payer: Self-pay | Admitting: Physician Assistant

## 2023-04-17 ENCOUNTER — Other Ambulatory Visit: Payer: Self-pay

## 2023-04-17 DIAGNOSIS — M5416 Radiculopathy, lumbar region: Secondary | ICD-10-CM | POA: Diagnosis not present

## 2023-04-18 ENCOUNTER — Ambulatory Visit: Payer: 59 | Admitting: Family Medicine

## 2023-04-18 ENCOUNTER — Other Ambulatory Visit: Payer: Self-pay

## 2023-04-18 ENCOUNTER — Encounter: Payer: Self-pay | Admitting: Family Medicine

## 2023-04-18 VITALS — BP 132/82 | HR 82 | Temp 97.8°F | Ht 75.0 in | Wt 198.8 lb

## 2023-04-18 DIAGNOSIS — R599 Enlarged lymph nodes, unspecified: Secondary | ICD-10-CM | POA: Diagnosis not present

## 2023-04-18 DIAGNOSIS — R590 Localized enlarged lymph nodes: Secondary | ICD-10-CM

## 2023-04-18 DIAGNOSIS — R079 Chest pain, unspecified: Secondary | ICD-10-CM

## 2023-04-18 DIAGNOSIS — R300 Dysuria: Secondary | ICD-10-CM

## 2023-04-18 LAB — COMPREHENSIVE METABOLIC PANEL
ALT: 17 U/L (ref 0–53)
AST: 18 U/L (ref 0–37)
Albumin: 4.4 g/dL (ref 3.5–5.2)
Alkaline Phosphatase: 52 U/L (ref 39–117)
BUN: 11 mg/dL (ref 6–23)
CO2: 31 meq/L (ref 19–32)
Calcium: 9.2 mg/dL (ref 8.4–10.5)
Chloride: 103 meq/L (ref 96–112)
Creatinine, Ser: 0.87 mg/dL (ref 0.40–1.50)
GFR: 109.04 mL/min (ref >60.00)
Glucose, Bld: 88 mg/dL (ref 70–99)
Potassium: 3.5 meq/L (ref 3.5–5.1)
Sodium: 139 meq/L (ref 135–145)
Total Bilirubin: 0.4 mg/dL (ref 0.2–1.2)
Total Protein: 7.6 g/dL (ref 6.0–8.3)

## 2023-04-18 LAB — CBC WITH DIFFERENTIAL/PLATELET
Basophils Absolute: 0 10*3/uL (ref 0.0–0.1)
Basophils Relative: 0.7 % (ref 0.0–3.0)
Eosinophils Absolute: 0.3 10*3/uL (ref 0.0–0.7)
Eosinophils Relative: 6 % — ABNORMAL HIGH (ref 0.0–5.0)
HCT: 44.6 % (ref 39.0–52.0)
Hemoglobin: 14.8 g/dL (ref 13.0–17.0)
Lymphocytes Relative: 31.4 % (ref 12.0–46.0)
Lymphs Abs: 1.6 10*3/uL (ref 0.7–4.0)
MCHC: 33.1 g/dL (ref 30.0–36.0)
MCV: 92.9 fL (ref 78.0–100.0)
Monocytes Absolute: 0.4 10*3/uL (ref 0.1–1.0)
Monocytes Relative: 7 % (ref 3.0–12.0)
Neutro Abs: 2.8 10*3/uL (ref 1.4–7.7)
Neutrophils Relative %: 54.9 % (ref 43.0–77.0)
Platelets: 314 10*3/uL (ref 150.0–400.0)
RBC: 4.8 Mil/uL (ref 4.22–5.81)
RDW: 13.5 % (ref 11.5–15.5)
WBC: 5 10*3/uL (ref 4.0–10.5)

## 2023-04-18 LAB — SEDIMENTATION RATE: Sed Rate: 10 mm/h (ref 0–15)

## 2023-04-18 LAB — C-REACTIVE PROTEIN: CRP: <1 mg/dL (ref 0.5–20.0)

## 2023-04-18 NOTE — Progress Notes (Signed)
Phone [phone] In person visit   Subjective:   Frank Young is a 37 y.o. year old very pleasant male patient who presents for/with See problem oriented charting Chief Complaint  Patient presents with   er f/u    Pt here for ER f/u due to chest pain, he is still experiencing chest pain at times.   Past Medical History-  Patient Active Problem List   Diagnosis Date Noted   Anxiety     Priority: Medium    GERD (gastroesophageal reflux disease)     Priority: Medium    Chronic tension headaches     Priority: Medium    Acute left-sided low back pain with left-sided sciatica 10/04/2021    Priority: Low   Cubital tunnel syndrome, left 04/13/2021    Priority: Low   Allergic rhinitis     Priority: Low   Complex tear of medial meniscus of left knee as current injury 11/13/2022    Medications- reviewed and updated Current Outpatient Medications  Medication Sig Dispense Refill   augmented betamethasone dipropionate (DIPROLENE-AF) 0.05 % ointment Apply topically once as needed. 30 g 1   azelastine (ASTELIN) 0.1 % nasal spray Place 1 spray into both nostrils 2 (two) times daily. Use in each nostril as directed 90 mL 3   B Complex Vitamins (B-COMPLEX/B-12 PO) Take 1,000 mcg by mouth daily.     Dexlansoprazole 30 MG capsule DR Take 1 capsule (30 mg total) by mouth daily. 30 capsule 11   fexofenadine (ALLEGRA) 180 MG tablet Take 180 mg by mouth daily.     fluticasone (FLONASE) 50 MCG/ACT nasal spray Place 1 spray into both nostrils daily as needed for allergies or rhinitis. 16 g 3   Meth-Hyo-M Bl-Na Phos-Ph Sal (URIBEL) 118 MG CAPS Take 1 capsule by mouth Three (3) times a day as needed (burning with urination). 21 capsule 0   No current facility-administered medications for this visit.     Objective:  BP 132/82   Pulse 82   Temp 97.8 F (36.6 C)   Ht 6\' 3"  (1.905 m)   Wt 198 lb 12.8 oz (90.2 kg)   SpO2 97%   BMI 24.85 kg/m  Well-appearing Lungs: nonlabored, normal  respiratory rate Abdomen: soft/nondistended  No visible edema in the legs    Assessment and Plan    # Emergency Department follow up for chest pain  S: most recent illness was 03/10/23 and he had fever and sore throat- COVID negative and strep negative. Conservative measures recommended- thought to be viral  Patient seen in Emergency Department 03/16/23 and diagnosed with orchitis after presenting with abdominal and testicular pain- on CT there was "CT IMPRESSION: 1. No definite acute abdominopelvic abnormality is seen. 2. There is mild mural thickening of the distal rectum which could be underdistention versus mild proctitis. 3. Retroperitoneal lymphadenopathy, of uncertain etiology. Testicular ultrasound has been ordered at the time of this interpretation. If unremarkable, recommend follow-up CT in 3-6 months to ensure resolution. "   And CT  "1. Increased vascular flow to the left testicle, which could be seen in setting of orchitis in the proper clinical setting. 2. No sonographic evidence of testicular mass or testicular torsion. 3. Tiny 2 mm left epididymal head cyst. " -he was treated with levaquin and given Toradol for pain. 10 days of levaquin with tramadol for pain.  -also on 03/19/23 had [place] of soft tissues for lymph node in right groin- he reports that is still present- there was not  a comment on CT so wanted to be sure not something else going on.  -he later on 03/19/23 went back to Emergency Department complaining of fever/chills/fatigue- workup was reassuring and they did not feel repeat CT or [place] indicated. He was tachycardic as well (EKG noted as NSR with incomplete right bundle branch block) - no notes specifically on assessment for that. Respiratory viral panel negative. White blood count slightly high at 12k with right shift.   He was also seen yesterday for lumbar radiculopathy by [facility]- noted to be improving. He reports that testicles started getting better and issues  have resolved- with recurrent fever though went back to Emergency Department worried about fever up to 103. He reports they did not do blood cultures or repeat imaging on 2nd Emergency Department visit but he was feeling so much better they opted not to pursue jointly.   He felt like symptoms had fully resolved.   Last week was out of town in [place] with some friends. Felt generally fine other than possible urethral stricture related issues- some stream change and faint burning between urination (plans to see urologist)- he has urobel but was low so he took AZO- both of those helped.   Arrived there last Wednesday the 6th. Ate a lot of [place] bbq and had 1 beer and some fried fatty foods- diet was not as clean. Played golf on Thursday. After golf he felt fine- went to distillery and dinner and had 3-4 drinks. As starting to leave started feeling some chest discomfort (compliant still with  dexilant 30). Throughout the night started bothering him more so he stopped drinking completely. He got home in [place] and didn't have any medication in [place]- laid down and pain intensified. Woke up an hour with intense pain 7-8/10- was able to find some tylenol which didn't help. Drove himself to ER - troponin x2 negative trend -D dimer negative -potassium was 3.4  - lipase negative -CBC reports 9.8/normal -reports EKG and CXR reported as normal - concern for esophageal spasms so gave gastroenterology cocktail (perhaps mild improvement) and dose of ativan -then quickly after given Toradol (so hard to tell if gastroenterology cocktail was the benefit) and started to improve after that- pain down to 4/10 and discharged home- they recommended gastrointestinal follow up and sent him home.  - they did not mention cardiology -denies any chest wall tenderness  He continued to have moderate level pain worse with laying down or playing golf- leaning forward to look at ball with putting was a trigger - that night pain  intensified again despite avoiding alcohol and trigger foods. Took ibuprofen to get to sleep and still woke up at 3 30 am with 9/10 pain. Took ibuprofen again 6 hours later.  - pain was so bad strongly considered going to Emergency Department but medications finally kicked in and did not go. -came home early because pain was so bad  Still pain has been so bad that he has needed ibuprofen every 6-8 hours since being home with some variable levels of pain. For instance worse at work yesterday with pai going into his neck.  -reflecting back to time in [place] noted some exertional symptoms and perhaps mild shortness of breath  - occasional palpitations with associated mild pain increase from moderate level pain -symptoms worsen off ibuprofen A/P: 37 year old male with both exertional and positional chest pain.  Does have baseline history of GERD but on Dexilant 30 mg and this is far out of proportion to  typical GERD symptoms.  NSAIDs could worsen GERD or ulceration but I do not think that is the highest on differential so we will address potential cardiac concerns first-obviously if he had any melena or bright red blood per rectum or worsening pain on NSAIDs should seek care - For the exertional portion we will do an urgent referral to cardiology -Considering pain worsens with laying down and improves with sitting up-I am concerned about pericarditis-check CRP and ESR-if these are elevated consider colchicine -He prefers to continue ibuprofen for now and he declines prescription NSAIDs -Discussed potentially reaching out to gastroenterology but he prefers to pursue the cardiac side of things first which is certainly understandable  Prior orchitis appears completely resolved.  He has had some mild burning in between urination-we will check a urine culture to be on the cautious side and he agrees to follow-up with urology  Of note he did have  Retroperitoneal lymphadenopathy and to be on the safe side he  prefers 3-6 months repeat.  Groin adenopathy he reports is stable and has already been imaged-I do not think we need to reimage outside of doing the repeat CT of abdomen and and pelvis with contrast  Recommended follow up: Return for as needed for new, worsening, persistent symptoms. Future Appointments  Date Time Provider Department Center  06/11/2023  1:00 PM Shelva Majestic, MD [facility]  He may end up canceling his January visit  Lab/Order associations:   ICD-10-CM   1. Dysuria  R30.0 Urine Culture    2. Exertional chest pain  R07.9 Sedimentation rate    Comprehensive metabolic panel    CBC with Differential/Platelet    C-reactive protein    Ambulatory referral to Cardiology    3. Enlarged lymph nodes  R59.9 CT ABDOMEN PELVIS W CONTRAST    4. Retroperitoneal lymphadenopathy  R59.0 CT ABDOMEN PELVIS W CONTRAST      Time Spent: 35 minutes of total time (12:20 PM-12:55 PM) was spent on the date of the encounter performing the following actions: chart review prior to seeing the patient including summarizing to emergency department notes, obtaining history, performing a medically necessary exam, counseling on the workup and potential treatment plan, placing orders, and documenting in our EHR.   Return precautions advised.  Tana Conch, MD

## 2023-04-18 NOTE — Patient Instructions (Addendum)
Please stop by lab before you go If you have mychart- we will send your results within 3 business days of [place] receiving them.  If you do not have mychart- we will call you about results within 5 business days of [place] receiving them.  *please also note that you will see labs on mychart as soon as they post. I will later go in and write notes on them- will say "notes from Dr. Durene Cal"   Urgent cardiology referral   We will call you within two weeks about your referral to CT abdomen pelvis to follow up lymphadenopathy  through [facility].  Their phone number is [phone].  Please call them if you have not heard in 1-2 weeks   Recommended follow up: Return for as needed for new, worsening, persistent symptoms. -obviously worsening symptoms seek care again

## 2023-04-19 LAB — URINE CULTURE
MICRO NUMBER:: 15731298
Result:: NO GROWTH
SPECIMEN QUALITY:: ADEQUATE

## 2023-04-24 ENCOUNTER — Ambulatory Visit: Payer: 59 | Attending: Cardiology | Admitting: Cardiology

## 2023-04-24 ENCOUNTER — Other Ambulatory Visit: Payer: Self-pay

## 2023-04-24 ENCOUNTER — Encounter: Payer: Self-pay | Admitting: Cardiology

## 2023-04-24 VITALS — BP 122/70 | HR 71 | Ht 75.0 in | Wt 200.6 lb

## 2023-04-24 DIAGNOSIS — K219 Gastro-esophageal reflux disease without esophagitis: Secondary | ICD-10-CM

## 2023-04-24 DIAGNOSIS — R072 Precordial pain: Secondary | ICD-10-CM | POA: Diagnosis not present

## 2023-04-24 DIAGNOSIS — R079 Chest pain, unspecified: Secondary | ICD-10-CM

## 2023-04-24 MED ORDER — METOPROLOL TARTRATE 100 MG PO TABS
100.0000 mg | ORAL_TABLET | Freq: Once | ORAL | 0 refills | Status: DC
  Filled 2023-04-24: qty 1, 1d supply, fill #0

## 2023-04-24 MED ORDER — ISOSORBIDE MONONITRATE ER 30 MG PO TB24
30.0000 mg | ORAL_TABLET | Freq: Every day | ORAL | 3 refills | Status: DC
  Filled 2023-04-24: qty 90, 90d supply, fill #0

## 2023-04-24 NOTE — Progress Notes (Signed)
Cardiology Office Note:    Date:  04/24/2023   ID:  Frank Young, DOB [birth year], MRN [id number]  PCP:  Shelva Majestic, MD   [facility] Providers Cardiologist:  Debbe Odea, MD     Referring MD: Shelva Majestic, MD   Chief Complaint  Patient presents with   New Patient (Initial Visit)    Referred for cardiac evaluation of Exertional chest pain.  Patient reports chest pain episode 2 weeks ago.  Continued intermittent chest pain and SOBr.  Chest pain is worse while lying.     Frank Young is a 37 y.o. male who is being seen today for the evaluation of chest pain at the request of Shelva Majestic, MD.   History of Present Illness:    Frank Young is a 37 y.o. male with a hx of GERD who presents with chest pain.  Symptoms began 2 weeks ago while in [place].  Symptoms initially occurred at rest but progressed to occurring with exertion.  Symptoms are different from his typical heartburn/reflux symptoms.  He typically takes ibuprofen 6 to 800 mg twice daily to help with symptoms.  He notices as ibuprofen wears off, symptoms return.  Presented to the ED in [facility] when symptoms began, workup was unrevealing with normal troponins, normal inflammatory markers/ESR, CRP.  Was told he may have esophageal spasm due to history of reflux.  Past Medical History:  Diagnosis Date   Allergy    claritin prn   Anxiety    sometimes gets concerned about medical issues. stress with work.    Chronic tension headaches    improved with change in pillow- monitoring to see if will still occur   Depression    College   GERD (gastroesophageal reflux disease)    prilosec 20mg  since weight gain- wants to be closer to 190 then trial off.    Lichen sclerosus 2020    Past Surgical History:  Procedure Laterality Date   17 HOUR PH STUDY N/A 06/01/2020   Procedure: 24 HOUR PH STUDY;  Surgeon: Shellia Cleverly, DO;  Location: [facility];  Service:  Gastroenterology;  Laterality: N/A;   CIRCUMCISION  08/2018   ESOPHAGEAL MANOMETRY N/A 06/01/2020   Procedure: ESOPHAGEAL MANOMETRY (EM);  Surgeon: Shellia Cleverly, DO;  Location: [facility];  Service: Gastroenterology;  Laterality: N/A;   ESOPHAGOGASTRODUODENOSCOPY (EGD) WITH PROPOFOL N/A 10/09/2022   Procedure: ESOPHAGOGASTRODUODENOSCOPY (EGD) WITH PROPOFOL;  Surgeon: Regis Bill, MD;  Location: [facility];  Service: Endoscopy;  Laterality: N/A;   KNEE ARTHROSCOPY WITH MEDIAL MENISECTOMY Left 11/13/2022   Procedure: LEFT KNEE ARTHROSCOPY WITH PARTIAL MEDIAL MENISCECTOMY;  Surgeon: Huel Cote, MD;  Location: [facility];  Service: Orthopedics;  Laterality: Left;   UPPER GASTROINTESTINAL ENDOSCOPY  01/07/2020   VASECTOMY     WISDOM TOOTH EXTRACTION Bilateral 2002    Current Medications: Current Meds  Medication Sig   augmented betamethasone dipropionate (DIPROLENE-AF) 0.05 % ointment Apply topically once as needed.   azelastine (ASTELIN) 0.1 % nasal spray Place 1 spray into both nostrils 2 (two) times daily. Use in each nostril as directed   B Complex Vitamins (B-COMPLEX/B-12 PO) Take 1,000 mcg by mouth daily.   cholecalciferol (VITAMIN D3) 25 MCG (1000 UNIT) tablet Take 2,000 Units by mouth daily.   Dexlansoprazole 30 MG capsule DR Take 1 capsule (30 mg total) by mouth daily.   fexofenadine (ALLEGRA) 180 MG tablet Take 180 mg by mouth daily.   fluticasone (  FLONASE) 50 MCG/ACT nasal spray Place 1 spray into both nostrils daily as needed for allergies or rhinitis.   isosorbide mononitrate (IMDUR) 30 MG 24 hr tablet Take 1 tablet (30 mg total) by mouth daily.   Meth-Hyo-M Bl-Na Phos-Ph Sal (URIBEL) 118 MG CAPS Take 1 capsule by mouth Three (3) times a day as needed (burning with urination).   metoprolol tartrate (LOPRESSOR) 100 MG tablet Take 1 tablet (100 mg total) by mouth once for 1 dose. TWO HOURS PRIOR TO CARDIAC CT     Allergies:   Patient has no  known allergies.   Social History   Socioeconomic History   Marital status: Married    Spouse name: Not on file   Number of children: 1   Years of education: Not on file   Highest education level: Professional school degree (e.g., MD, DDS, DVM, JD)  Occupational History   Occupation: physician  Tobacco Use   Smoking status: Never   Smokeless tobacco: Never  Vaping Use   Vaping status: Never Used  Substance and Sexual Activity   Alcohol use: Yes    Alcohol/week: 1.0 - 2.0 standard drink of alcohol    Types: 1 - 2 Cans of beer per week    Comment: weekly   Drug use: No   Sexual activity: Yes  Other Topics Concern   Not on file  Social History Narrative   Married. 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 [employer] residency      Enjoys golfing   Social Determinants of Health   Financial Resource Strain: Low Risk  (04/17/2023)   Overall Financial Resource Strain (CARDIA)    Difficulty of Paying Living Expenses: Not hard at all  Food Insecurity: No Food Insecurity (04/17/2023)   Hunger Vital Sign    Worried About Running Out of Food in the Last Year: Never true    Ran Out of Food in the Last Year: Never true  Transportation Needs: No Transportation Needs (04/17/2023)   PRAPARE - Administrator, Civil Service (Medical): No    Lack of Transportation (Non-Medical): No  Physical Activity: Insufficiently Active (04/17/2023)   Exercise Vital Sign    Days of Exercise per Week: 2 days    Minutes of Exercise per Session: 30 min  Stress: No Stress Concern Present (04/17/2023)   [employer] of Occupational Health - Occupational Stress Questionnaire    Feeling of Stress : Only a little  Social Connections: Socially Isolated (04/17/2023)   Social Connection and Isolation Panel [NHANES]    Frequency of Communication with Friends and Family: Once a week     Frequency of Social Gatherings with Friends and Family: Once a week    Attends Religious Services: Never    Database administrator or Organizations: No    Attends Engineer, structural: Not on file    Marital Status: Married     Family History: The patient's family history includes Arthritis/Rheumatoid in his paternal grandmother; Cancer in his father; Colon cancer in his paternal grandfather; Diabetes in his maternal grandfather; Healthy in his mother; Heart disease in his paternal grandfather; Hyperlipidemia in his paternal uncle; Hypertension in his paternal uncle; Lung cancer in his maternal grandmother; Skin cancer in his paternal grandfather and paternal grandmother; Stroke in his maternal grandfather; Testicular cancer in his paternal uncle. There is no  history of Esophageal cancer, Rectal cancer, or Stomach cancer.  ROS:   Please see the history of present illness.     All other systems reviewed and are negative.  EKGs/Labs/Other Studies Reviewed:    The following studies were reviewed today:  EKG Interpretation Date/Time:  Wednesday April 24 2023 09:25:17 EST Ventricular Rate:  71 PR Interval:  160 QRS Duration:  100 QT Interval:  396 QTC Calculation: 430 R Axis:   44  Text Interpretation: Normal sinus rhythm Incomplete right bundle branch block Confirmed by Debbe Odea ([id number]) on 04/24/2023 9:28:37 AM    Recent Labs: 04/18/2023: ALT 17; BUN 11; Creatinine, Ser 0.87; Hemoglobin 14.8; Platelets 314.0; Potassium 3.5; Sodium 139  Recent Lipid Panel    Component Value Date/Time   CHOL 158 12/28/2021 1024   TRIG 95.0 12/28/2021 1024   HDL 49.30 12/28/2021 1024   CHOLHDL 3 12/28/2021 1024   VLDL 19.0 12/28/2021 1024   LDLCALC 89 12/28/2021 1024   LDLCALC 98 09/06/2017 0000     Risk Assessment/Calculations:             Physical Exam:    VS:  BP 122/70 (BP Location: Right Arm, Patient Position: Sitting, Cuff Size: Large)   Pulse 71   Ht 6\' 3"   (1.905 m)   Wt 200 lb 9.6 oz (91 kg)   SpO2 99%   BMI 25.07 kg/m     Wt Readings from Last 3 Encounters:  04/24/23 200 lb 9.6 oz (91 kg)  04/18/23 198 lb 12.8 oz (90.2 kg)  02/19/23 198 lb 6.4 oz (90 kg)     GEN:  Well nourished, well developed in no acute distress HEENT: Normal NECK: No JVD; No carotid bruits CARDIAC: RRR, no murmurs, rubs, gallops RESPIRATORY:  Clear to auscultation without rales, wheezing or rhonchi  ABDOMEN: Soft, non-tender, non-distended MUSCULOSKELETAL:  No edema; No deformity  SKIN: Warm and dry NEUROLOGIC:  Alert and oriented x 3 PSYCHIATRIC:  Normal affect   ASSESSMENT:    1. Precordial pain   2. Exertional chest pain   3. Gastroesophageal reflux disease, unspecified whether esophagitis present    PLAN:    In order of problems listed above:  Chest pain, associated with exertion.  No known cardiac risk factors.  Obtain echo, obtain coronary CTA.  History of reflux, start Imdur for possible esophageal spasm.   History of GERD, continue PPI.  Follow-up after cardiac testing.       Medication Adjustments/Labs and Tests Ordered: Current medicines are reviewed at length with the patient today.  Concerns regarding medicines are outlined above.  Orders Placed This Encounter  Procedures   CT CORONARY MORPH W/CTA COR W/SCORE W/CA W/CM &/OR WO/CM   EKG 12-Lead   ECHOCARDIOGRAM COMPLETE   Meds ordered this encounter  Medications   isosorbide mononitrate (IMDUR) 30 MG 24 hr tablet    Sig: Take 1 tablet (30 mg total) by mouth daily.    Dispense:  90 tablet    Refill:  3   metoprolol tartrate (LOPRESSOR) 100 MG tablet    Sig: Take 1 tablet (100 mg total) by mouth once for 1 dose. TWO HOURS PRIOR TO CARDIAC CT    Dispense:  1 tablet    Refill:  0    Patient Instructions  Medication Instructions:   START imdur - Take one tablet ( 30mg ) by mouth daily.   *If you need a refill on your cardiac medications before your next appointment, please  call your pharmacy*  Lab Work:  Your physician recommends you have labs today - BMP   If you have labs (blood work) drawn today and your tests are completely normal, you will receive your results only by: MyChart Message (if you have MyChart) OR A paper copy in the mail If you have any lab test that is abnormal or we need to change your treatment, we will call you to review the results.   Testing/Procedures:  Your physician has requested that you have an echocardiogram. Echocardiography is a painless test that uses sound waves to create images of your heart. It provides your doctor with information about the size and shape of your heart and how well your heart's chambers and valves are working. This procedure takes approximately one hour. There are no restrictions for this procedure. Please do NOT wear cologne, perfume, aftershave, or lotions (deodorant is allowed). Please arrive 15 minutes prior to your appointment time.  Please note: We ask at that you not bring children with you during ultrasound (echo/ vascular) testing. Due to room size and safety concerns, children are not allowed in the ultrasound rooms during exams. Our front office staff cannot provide observation of children in our lobby area while testing is being conducted. An adult accompanying a patient to their appointment will only be allowed in the ultrasound room at the discretion of the ultrasound technician under special circumstances. We apologize for any inconvenience.    Your cardiac CT will be scheduled at one of the below locations:   [facility] [address] [place], [place] [address] [phone]  OR   [facility] [address] [place], [place] [address] [phone]   If scheduled at [facility] or [facility], please arrive 15 mins early for check-in and test prep.  There is  spacious parking and easy access to the radiology department from the [facility] entrance. Please enter here and check-in with the desk attendant.   Please follow these instructions carefully (unless otherwise directed):  An IV will be required for this test and Nitroglycerin will be given.  Hold all erectile dysfunction medications at least 3 days (72 hrs) prior to test. (Ie viagra, cialis, sildenafil, tadalafil, etc)   On the Night Before the Test: Be sure to Drink plenty of water. Do not consume any caffeinated/decaffeinated beverages or chocolate 12 hours prior to your test. Do not take any antihistamines 12 hours prior to your test.  On the Day of the Test: Drink plenty of water until 1 hour prior to the test. Do not eat any food 1 hour prior to test. You may take your regular medications prior to the test.  Take metoprolol (Lopressor) two hours prior to test. If you take Furosemide/Hydrochlorothiazide/Spironolactone, please HOLD on the morning of the test.        After the Test: Drink plenty of water. After receiving IV contrast, you may experience a mild flushed feeling. This is normal. On occasion, you may experience a mild rash up to 24 hours after the test. This is not dangerous. If this occurs, you can take Benadryl 25 mg and increase your fluid intake. If you experience trouble breathing, this can be serious. If it is severe call 911 IMMEDIATELY. If it is mild, please call our office. If you take any of these medications: Glipizide/Metformin, Avandament, Glucavance, please do not take 48 hours after completing test unless otherwise instructed.  We will call to  schedule your test 2-4 weeks out understanding that some insurance companies will need an authorization prior to the service being performed.   For more information and frequently asked questions, please visit our website : [email or web address]  For non-scheduling related questions, please  contact the cardiac imaging nurse navigator should you have any questions/concerns: Cardiac Imaging Nurse Navigators Direct Office Dial: [phone]   For scheduling needs, including cancellations and rescheduling, please call [place], [phone].   Follow-Up: At [facility], you and your health needs are our priority.  As part of our continuing mission to provide you with exceptional heart care, we have created designated Provider Care Teams.  These Care Teams include your primary Cardiologist (physician) and Advanced Practice Providers (APPs -  Physician Assistants and Nurse Practitioners) who all work together to provide you with the care you need, when you need it.  We recommend signing up for the patient portal called "MyChart".  Sign up information is provided on this After Visit Summary.  MyChart is used to connect with patients for Virtual Visits (Telemedicine).  Patients are able to view lab/test results, encounter notes, upcoming appointments, etc.  Non-urgent messages can be sent to your provider as well.   To learn more about what you can do with MyChart, go to [email or web address].    Your next appointment:    After Cardiac testing  Provider:   Debbe Odea, MD ONLY      Signed, Debbe Odea, MD  04/24/2023 10:24 AM    [facility]

## 2023-04-24 NOTE — Patient Instructions (Addendum)
Medication Instructions:   START imdur - Take one tablet ( 30mg ) by mouth daily.   *If you need a refill on your cardiac medications before your next appointment, please call your pharmacy*   Lab Work:  Your physician recommends you have labs today - BMP   If you have labs (blood work) drawn today and your tests are completely normal, you will receive your results only by: MyChart Message (if you have MyChart) OR A paper copy in the mail If you have any lab test that is abnormal or we need to change your treatment, we will call you to review the results.   Testing/Procedures:  Your physician has requested that you have an echocardiogram. Echocardiography is a painless test that uses sound waves to create images of your heart. It provides your doctor with information about the size and shape of your heart and how well your heart's chambers and valves are working. This procedure takes approximately one hour. There are no restrictions for this procedure. Please do NOT wear cologne, perfume, aftershave, or lotions (deodorant is allowed). Please arrive 15 minutes prior to your appointment time.  Please note: We ask at that you not bring children with you during ultrasound (echo/ vascular) testing. Due to room size and safety concerns, children are not allowed in the ultrasound rooms during exams. Our front office staff cannot provide observation of children in our lobby area while testing is being conducted. An adult accompanying a patient to their appointment will only be allowed in the ultrasound room at the discretion of the ultrasound technician under special circumstances. We apologize for any inconvenience.    Your cardiac CT will be scheduled at one of the below locations:   [facility] [address] [place], [place] [address] [phone]  OR   [facility] [address] [place], [place] [address] [phone]   If scheduled at [facility] or [facility], please arrive 15 mins early for check-in and test prep.  There is spacious parking and easy access to the radiology department from the [facility] Heart and Vascular entrance. Please enter here and check-in with the desk attendant.   Please follow these instructions carefully (unless otherwise directed):  An IV will be required for this test and Nitroglycerin will be given.  Hold all erectile dysfunction medications at least 3 days (72 hrs) prior to test. (Ie viagra, cialis, sildenafil, tadalafil, etc)   On the Night Before the Test: Be sure to Drink plenty of water. Do not consume any caffeinated/decaffeinated beverages or chocolate 12 hours prior to your test. Do not take any antihistamines 12 hours prior to your test.  On the Day of the Test: Drink plenty of water until 1 hour prior to the test. Do not eat any food 1 hour prior to test. You may take your regular medications prior to the test.  Take metoprolol (Lopressor) two hours prior to test. If you take Furosemide/Hydrochlorothiazide/Spironolactone, please HOLD on the morning of the test.        After the Test: Drink plenty of water. After receiving IV contrast, you may experience a mild flushed feeling. This is normal. On occasion, you may experience a mild rash up to 24 hours after the test. This is not dangerous. If this occurs, you can take Benadryl 25 mg and increase your fluid intake. If you experience trouble breathing, this can be serious. If it is severe call 911  IMMEDIATELY. If it is mild, please call our office. If you take any of these medications: Glipizide/Metformin, Avandament, Glucavance, please do not take 48 hours after completing test unless otherwise instructed.  We will call to schedule your test 2-4 weeks out understanding that some insurance companies will need an authorization prior to the service being  performed.   For more information and frequently asked questions, please visit our website : [email or web address]  For non-scheduling related questions, please contact the cardiac imaging nurse navigator should you have any questions/concerns: Cardiac Imaging Nurse Navigators Direct Office Dial: [phone]   For scheduling needs, including cancellations and rescheduling, please call [place], [phone].   Follow-Up: At [facility], you and your health needs are our priority.  As part of our continuing mission to provide you with exceptional heart care, we have created designated Provider Care Teams.  These Care Teams include your primary Cardiologist (physician) and Advanced Practice Providers (APPs -  Physician Assistants and Nurse Practitioners) who all work together to provide you with the care you need, when you need it.  We recommend signing up for the patient portal called "MyChart".  Sign up information is provided on this After Visit Summary.  MyChart is used to connect with patients for Virtual Visits (Telemedicine).  Patients are able to view lab/test results, encounter notes, upcoming appointments, etc.  Non-urgent messages can be sent to your provider as well.   To learn more about what you can do with MyChart, go to [email or web address].    Your next appointment:    After Cardiac testing  Provider:   Debbe Odea, MD ONLY

## 2023-05-08 ENCOUNTER — Other Ambulatory Visit: Payer: Self-pay

## 2023-05-08 ENCOUNTER — Encounter: Payer: Self-pay | Admitting: Family Medicine

## 2023-05-08 DIAGNOSIS — R300 Dysuria: Secondary | ICD-10-CM | POA: Diagnosis not present

## 2023-05-08 DIAGNOSIS — L900 Lichen sclerosus et atrophicus: Secondary | ICD-10-CM | POA: Diagnosis not present

## 2023-05-08 DIAGNOSIS — N35911 Unspecified urethral stricture, male, meatal: Secondary | ICD-10-CM | POA: Diagnosis not present

## 2023-05-08 MED ORDER — URO-MP 118 MG PO CAPS
1.0000 | ORAL_CAPSULE | Freq: Three times a day (TID) | ORAL | 3 refills | Status: DC | PRN
  Filled 2023-05-08: qty 21, 7d supply, fill #0
  Filled 2023-07-02: qty 21, 7d supply, fill #1

## 2023-05-14 ENCOUNTER — Other Ambulatory Visit: Payer: Self-pay

## 2023-05-14 DIAGNOSIS — K219 Gastro-esophageal reflux disease without esophagitis: Secondary | ICD-10-CM

## 2023-05-14 MED ORDER — DEXLANSOPRAZOLE 30 MG PO CPDR
30.0000 mg | DELAYED_RELEASE_CAPSULE | Freq: Every day | ORAL | 0 refills | Status: DC
  Filled 2023-05-14: qty 90, 90d supply, fill #0

## 2023-05-14 NOTE — Telephone Encounter (Signed)
Spoke with pt, confirmed Dexilant dosage, pt states that he takes 60mg  once a day, called in 90 day supply to [facility]

## 2023-05-15 ENCOUNTER — Telehealth (HOSPITAL_COMMUNITY): Payer: Self-pay | Admitting: *Deleted

## 2023-05-15 ENCOUNTER — Other Ambulatory Visit: Payer: Self-pay

## 2023-05-15 NOTE — Telephone Encounter (Signed)
Reaching out to patient to offer assistance regarding upcoming cardiac imaging study; pt verbalizes understanding of appt date/time, parking situation and where to check in, pre-test NPO status and medications ordered, and verified current allergies; name and call back number provided for further questions should they arise  Klee Kolek RN Navigator Cardiac Imaging [facility] [phone] office [phone] cell  Patient to take 100mg metoprolol tartrate two hours prior to his cardiac CT scan. 

## 2023-05-16 ENCOUNTER — Ambulatory Visit
Admission: RE | Admit: 2023-05-16 | Discharge: 2023-05-16 | Disposition: A | Discharge status: Home / Self Care | Payer: 59 | Source: Ambulatory Visit | Attending: Cardiology | Admitting: Cardiology

## 2023-05-16 ENCOUNTER — Ambulatory Visit: Payer: 59 | Attending: Cardiology

## 2023-05-16 DIAGNOSIS — R072 Precordial pain: Secondary | ICD-10-CM

## 2023-05-16 LAB — ECHOCARDIOGRAM COMPLETE
Area-P 1/2: 3.65 cm2
S' Lateral: 3.2 cm

## 2023-05-16 MED ORDER — NITROGLYCERIN 0.4 MG SL SUBL
0.8000 mg | SUBLINGUAL_TABLET | Freq: Once | SUBLINGUAL | Status: AC
  Administered 2023-05-16: 0.8 mg via SUBLINGUAL

## 2023-05-16 MED ORDER — IOHEXOL 350 MG/ML SOLN
75.0000 mL | Freq: Once | INTRAVENOUS | Status: AC | PRN
  Administered 2023-05-16: 75 mL via INTRAVENOUS

## 2023-05-16 MED ORDER — SODIUM CHLORIDE 0.9 % IV SOLN: INTRAVENOUS | Status: DC

## 2023-05-16 NOTE — Progress Notes (Signed)
Patient tolerated CT well. Drank water after. Vital signs stable encourage to drink water throughout day.Reasons explained and verbalized understanding. Ambulated steady gait.  

## 2023-05-16 NOTE — Progress Notes (Deleted)

## 2023-05-20 ENCOUNTER — Encounter: Payer: Self-pay | Admitting: Family Medicine

## 2023-05-20 DIAGNOSIS — Z111 Encounter for screening for respiratory tuberculosis: Secondary | ICD-10-CM

## 2023-05-20 DIAGNOSIS — Z789 Other specified health status: Secondary | ICD-10-CM

## 2023-05-21 ENCOUNTER — Other Ambulatory Visit: Payer: 59

## 2023-05-22 ENCOUNTER — Other Ambulatory Visit (HOSPITAL_COMMUNITY): Payer: Self-pay

## 2023-05-24 ENCOUNTER — Other Ambulatory Visit (INDEPENDENT_AMBULATORY_CARE_PROVIDER_SITE_OTHER): Payer: 59

## 2023-05-24 DIAGNOSIS — Z111 Encounter for screening for respiratory tuberculosis: Secondary | ICD-10-CM

## 2023-05-24 DIAGNOSIS — Z789 Other specified health status: Secondary | ICD-10-CM

## 2023-05-24 NOTE — Addendum Note (Signed)
Addended by: Shelva Majestic on: 05/24/2023 12:49 PM   Modules accepted: Orders

## 2023-05-25 LAB — VARICELLA ZOSTER ANTIBODY, IGG: Varicella IgG: 14.1 {s_co_ratio}

## 2023-05-27 DIAGNOSIS — N35911 Unspecified urethral stricture, male, meatal: Secondary | ICD-10-CM | POA: Diagnosis not present

## 2023-05-29 LAB — QUANTIFERON-TB GOLD PLUS
Mitogen-NIL: >10 [IU]/mL
NIL: 0.02 [IU]/mL
QuantiFERON-TB Gold Plus: NEGATIVE
TB1-NIL: 0 [IU]/mL
TB2-NIL: 0 [IU]/mL

## 2023-06-06 ENCOUNTER — Other Ambulatory Visit (HOSPITAL_COMMUNITY): Payer: Self-pay

## 2023-06-11 ENCOUNTER — Ambulatory Visit: Payer: 59 | Admitting: Family Medicine

## 2023-06-14 IMAGING — DX DG LUMBAR SPINE 2-3V
3 series · 3 of 3 positions shown · non-contrast
Comparison: X-ray 11/19/2019.

CLINICAL DATA: Low back pain x 4-6 weeks. Patient locates pain to
lower back/SI joint.

EXAM:
LUMBAR SPINE - 2-3 VIEW

[l-spine ap]
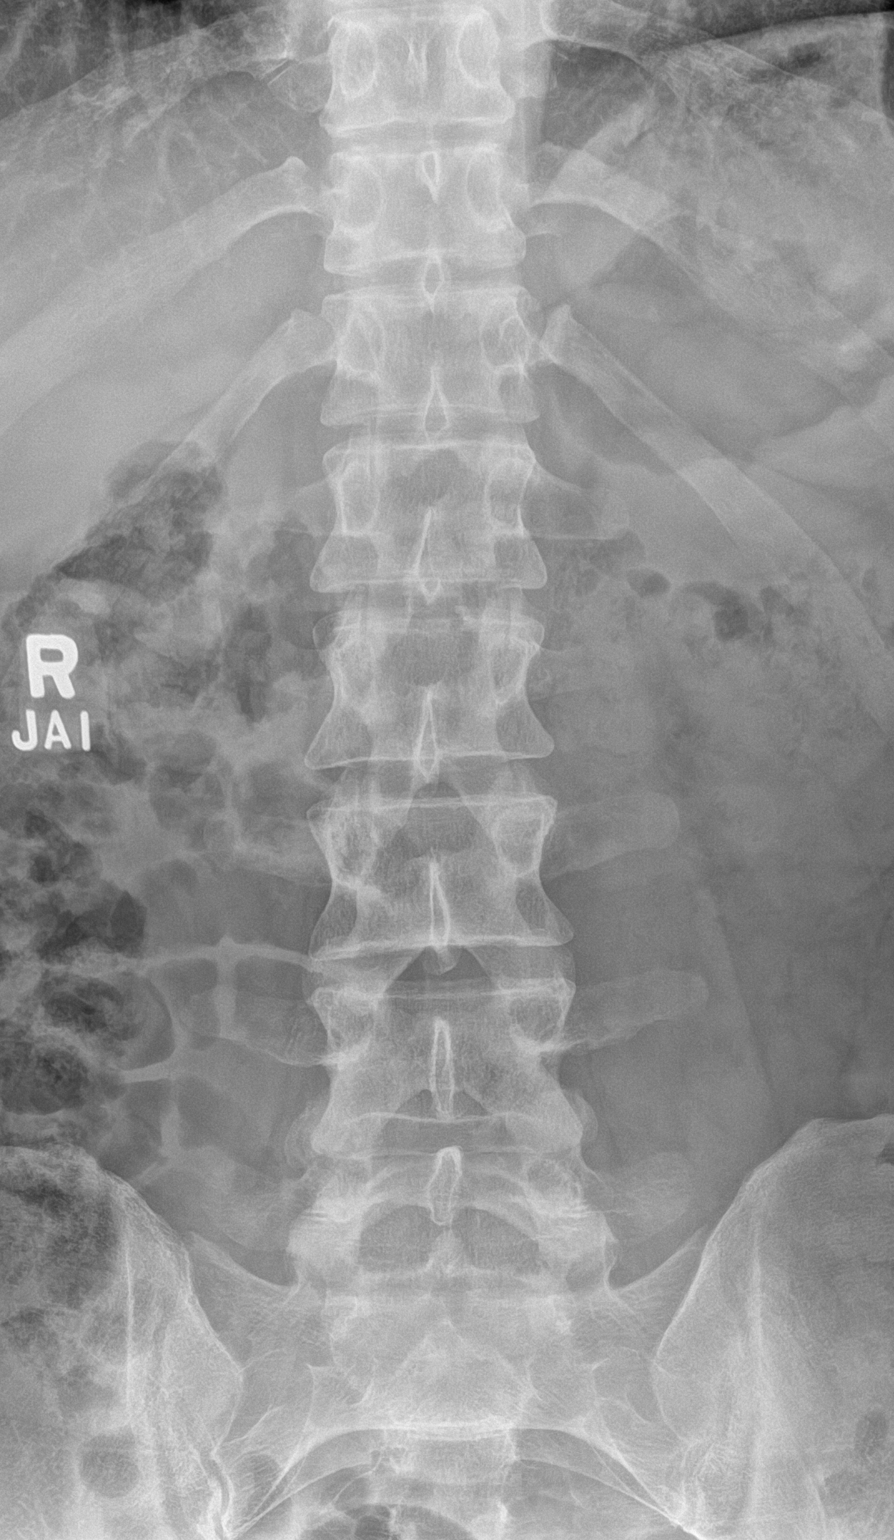

[l-spine lateral]
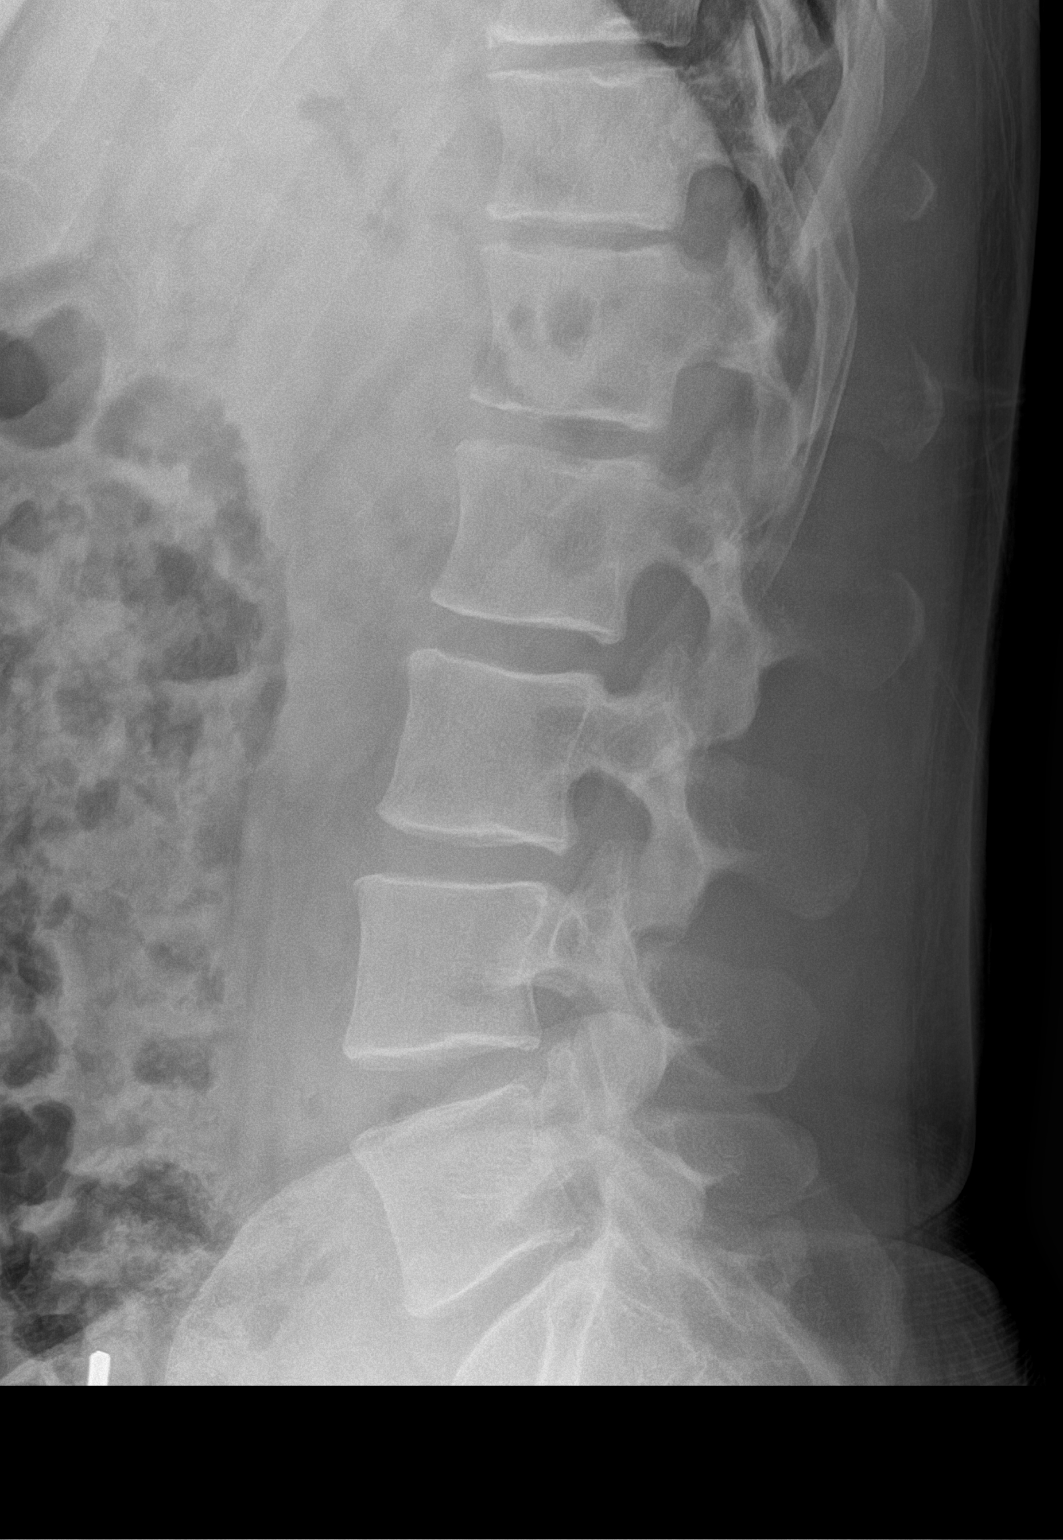

[l-spine spot]
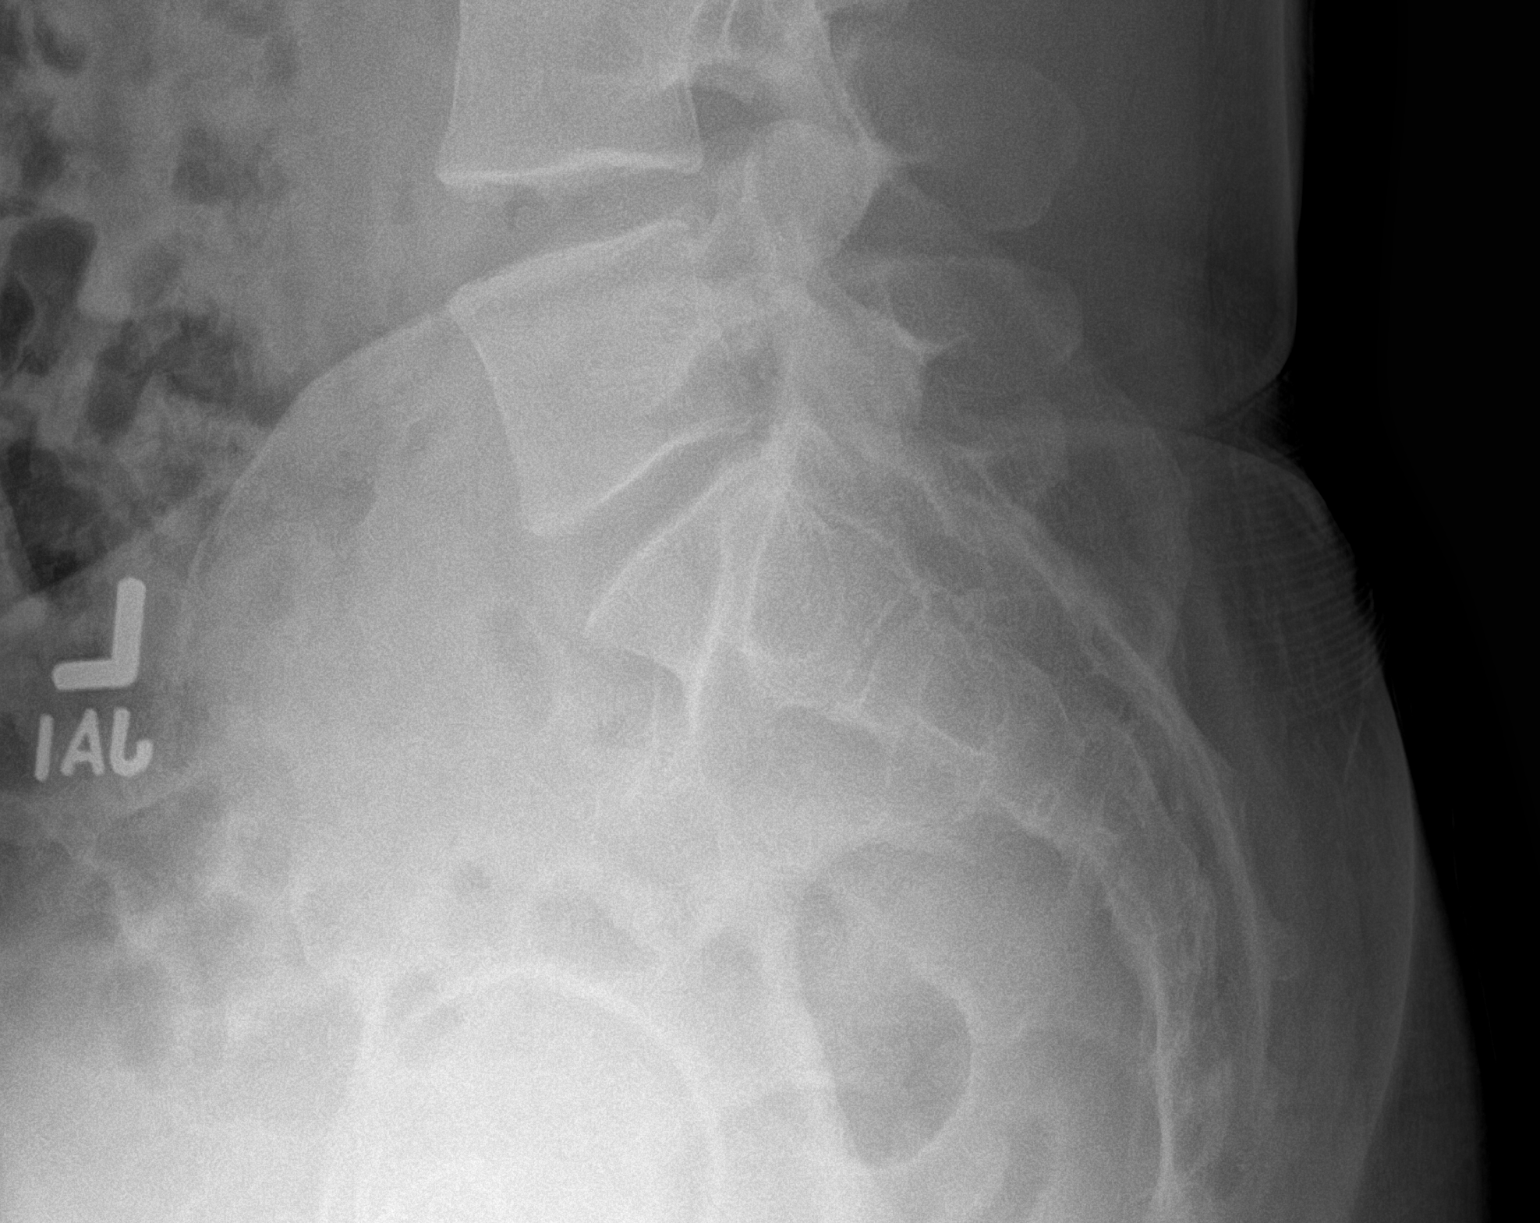

[3 of 3 positions shown; findings below may reference images not displayed]

FINDINGS: There is no evidence of lumbar spine fracture. Alignment is normal.
Minimal narrow intervertebral space at L5-S1 and in the lower
thoracic spine noted.
IMPRESSION: Mild degenerative joint changes of spine as described.

## 2023-06-16 ENCOUNTER — Other Ambulatory Visit: Payer: Self-pay

## 2023-06-20 ENCOUNTER — Other Ambulatory Visit (HOSPITAL_COMMUNITY): Payer: Self-pay

## 2023-06-27 ENCOUNTER — Other Ambulatory Visit (HOSPITAL_COMMUNITY): Payer: Self-pay

## 2023-07-01 ENCOUNTER — Encounter: Payer: Self-pay | Admitting: Family Medicine

## 2023-07-04 ENCOUNTER — Encounter: Payer: Self-pay | Admitting: Cardiology

## 2023-07-04 ENCOUNTER — Ambulatory Visit: Payer: Commercial Managed Care - PPO | Attending: Cardiology | Admitting: Cardiology

## 2023-07-04 VITALS — BP 122/80 | HR 71 | Ht 75.0 in | Wt 199.2 lb

## 2023-07-04 DIAGNOSIS — R072 Precordial pain: Secondary | ICD-10-CM

## 2023-07-04 DIAGNOSIS — K219 Gastro-esophageal reflux disease without esophagitis: Secondary | ICD-10-CM

## 2023-07-04 NOTE — Progress Notes (Signed)
Cardiology Office Note:    Date:  07/04/2023   ID:  Frank Young, DOB 16-Jan-1986, MRN [id number]  PCP:  Shelva Majestic, MD   [facility] Providers Cardiologist:  Debbe Odea, MD     Referring MD: Shelva Majestic, MD   Chief Complaint  Patient presents with   Follow-up    Discuss cardiac testing results.       History of Present Illness:    Frank Young is a 38 y.o. male with a hx of GERD who presents  for follow up.    Previously seen with symptoms of chest pain, workup with echo and coronary CTA performed to evaluate cardiac etiology.  He states symptoms have since resolved.  He feels well, has no concerns at this time.  Presents for cardiac testing results.   Past Medical History:  Diagnosis Date   Allergy    claritin prn   Anxiety    sometimes gets concerned about medical issues. stress with work.    Chronic tension headaches    improved with change in pillow- monitoring to see if will still occur   Depression    [employer]   GERD (gastroesophageal reflux disease)    prilosec 20mg  since weight gain- wants to be closer to 190 then trial off.    Lichen sclerosus 2020    Past Surgical History:  Procedure Laterality Date   70 HOUR PH STUDY N/A 06/01/2020   Procedure: 24 HOUR PH STUDY;  Surgeon: Shellia Cleverly, DO;  Location: [facility];  Service: Gastroenterology;  Laterality: N/A;   CIRCUMCISION  08/2018   ESOPHAGEAL MANOMETRY N/A 06/01/2020   Procedure: ESOPHAGEAL MANOMETRY (EM);  Surgeon: Shellia Cleverly, DO;  Location: [facility];  Service: Gastroenterology;  Laterality: N/A;   ESOPHAGOGASTRODUODENOSCOPY (EGD) WITH PROPOFOL N/A 10/09/2022   Procedure: ESOPHAGOGASTRODUODENOSCOPY (EGD) WITH PROPOFOL;  Surgeon: Regis Bill, MD;  Location: [facility];  Service: Endoscopy;  Laterality: N/A;   KNEE ARTHROSCOPY WITH MEDIAL MENISECTOMY Left 11/13/2022   Procedure: LEFT KNEE ARTHROSCOPY WITH PARTIAL MEDIAL MENISCECTOMY;   Surgeon: Huel Cote, MD;  Location: [facility];  Service: Orthopedics;  Laterality: Left;   UPPER GASTROINTESTINAL ENDOSCOPY  01/07/2020   VASECTOMY     WISDOM TOOTH EXTRACTION Bilateral 2002    Current Medications: Current Meds  Medication Sig   augmented betamethasone dipropionate (DIPROLENE-AF) 0.05 % ointment Apply topically once as needed.   azelastine (ASTELIN) 0.1 % nasal spray Place 1 spray into both nostrils 2 (two) times daily. Use in each nostril as directed   B Complex Vitamins (B-COMPLEX/B-12 PO) Take 1,000 mcg by mouth daily.   cholecalciferol (VITAMIN D3) 25 MCG (1000 UNIT) tablet Take 2,000 Units by mouth daily.   Dexlansoprazole 30 MG capsule DR Take 1 capsule (30 mg total) by mouth daily.   fexofenadine (ALLEGRA) 180 MG tablet Take 180 mg by mouth daily.   fluticasone (FLONASE) 50 MCG/ACT nasal spray Place 1 spray into both nostrils daily as needed for allergies or rhinitis.   Meth-Hyo-M Bl-Na Phos-Ph Sal (URIBEL) 118 MG CAPS Take 1 capsule by mouth Three (3) times a day as needed (burning with urination).   Meth-Hyo-M Bl-Na Phos-Ph Sal (URO-MP) 118 MG CAPS Take 1 capsule (118 mg total) by mouth 3 (three) times daily as needed. (burning with urination).     Allergies:   Patient has no known allergies.   Social History   Socioeconomic History   Marital status: Married    Spouse name:  Not on file   Number of children: 1   Years of education: Not on file   Highest education level: Professional school degree (e.g., MD, DDS, DVM, JD)  Occupational History   Occupation: physician  Tobacco Use   Smoking status: Never   Smokeless tobacco: Never  Vaping Use   Vaping status: Never Used  Substance and Sexual Activity   Alcohol use: Yes    Alcohol/week: 1.0 - 2.0 standard drink of alcohol    Types: 1 - 2 Cans of beer per week    Comment: weekly   Drug use: No   Sexual activity: Yes  Other Topics Concern   Not on file  Social History Narrative    Married. 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      Enjoys golfing   Social Drivers of Health   Financial Resource Strain: Low Risk  (04/17/2023)   Overall Financial Resource Strain (CARDIA)    Difficulty of Paying Living Expenses: Not hard at all  Food Insecurity: No Food Insecurity (04/17/2023)   Hunger Vital Sign    Worried About Running Out of Food in the Last Year: Never true    Ran Out of Food in the Last Year: Never true  Transportation Needs: No Transportation Needs (04/17/2023)   PRAPARE - Administrator, Civil Service (Medical): No    Lack of Transportation (Non-Medical): No  Physical Activity: Insufficiently Active (04/17/2023)   Exercise Vital Sign    Days of Exercise per Week: 2 days    Minutes of Exercise per Session: 30 min  Stress: No Stress Concern Present (04/17/2023)   [employer] of Occupational Health - Occupational Stress Questionnaire    Feeling of Stress : Only a little  Social Connections: Socially Isolated (04/17/2023)   Social Connection and Isolation Panel [NHANES]    Frequency of Communication with Friends and Family: Once a week    Frequency of Social Gatherings with Friends and Family: Once a week    Attends Religious Services: Never    Database administrator or Organizations: No    Attends Engineer, structural: Not on file    Marital Status: Married     Family History: The patient's family history includes Arthritis/Rheumatoid in his paternal grandmother; Cancer in his father; Colon cancer in his paternal grandfather; Diabetes in his maternal grandfather; Healthy in his mother; Heart disease in his paternal grandfather; Hyperlipidemia in his paternal uncle; Hypertension in his paternal uncle; Lung cancer in his maternal grandmother; Skin cancer in his paternal grandfather and paternal grandmother;  Stroke in his maternal grandfather; Testicular cancer in his paternal uncle. There is no history of Esophageal cancer, Rectal cancer, or Stomach cancer.  ROS:   Please see the history of present illness.     All other systems reviewed and are negative.  EKGs/Labs/Other Studies Reviewed:    The following studies were reviewed today:       Recent Labs: 04/18/2023: ALT 17; BUN 11; Creatinine, Ser 0.87; Hemoglobin 14.8; Platelets 314.0; Potassium 3.5; Sodium 139  Recent Lipid Panel    Component Value Date/Time   CHOL 158 12/28/2021 1024   TRIG 95.0 12/28/2021 1024   HDL 49.30 12/28/2021 1024   CHOLHDL 3 12/28/2021 1024   VLDL 19.0 12/28/2021 1024   LDLCALC 89 12/28/2021 1024   LDLCALC 98  09/06/2017 0000     Risk Assessment/Calculations:             Physical Exam:    VS:  BP 122/80 (BP Location: Left Arm, Patient Position: Sitting, Cuff Size: Normal)   Pulse 71   Ht 6\' 3"  (1.905 m)   Wt 199 lb 3.2 oz (90.4 kg)   SpO2 99%   BMI 24.90 kg/m     Wt Readings from Last 3 Encounters:  07/04/23 199 lb 3.2 oz (90.4 kg)  04/24/23 200 lb 9.6 oz (91 kg)  04/18/23 198 lb 12.8 oz (90.2 kg)     GEN:  Well nourished, well developed in no acute distress HEENT: Normal NECK: No JVD; No carotid bruits CARDIAC: RRR, no murmurs, rubs, gallops RESPIRATORY:  Clear to auscultation without rales, wheezing or rhonchi  ABDOMEN: Soft, non-tender, non-distended MUSCULOSKELETAL:  No edema; No deformity  SKIN: Warm and dry NEUROLOGIC:  Alert and oriented x 3 PSYCHIATRIC:  Normal affect   ASSESSMENT:    1. Precordial pain   2. Gastroesophageal reflux disease, unspecified whether esophagitis present    PLAN:    In order of problems listed above:  Chest pain, echo showed normal Function, EF 60-65%. CCTA showed no CAD.  Chest pain resolved.  Patient made aware of results, reassured. History of GERD, continue PPI.  Follow-up as needed.     Medication Adjustments/Labs and Tests  Ordered: Current medicines are reviewed at length with the patient today.  Concerns regarding medicines are outlined above.  No orders of the defined types were placed in this encounter.  No orders of the defined types were placed in this encounter.   Patient Instructions  Medication Instructions:   Your physician recommends that you continue on your current medications as directed. Please refer to the Current Medication list given to you today.  *If you need a refill on your cardiac medications before your next appointment, please call your pharmacy*   Lab Work:  None Ordered  If you have labs (blood work) drawn today and your tests are completely normal, you will receive your results only by: MyChart Message (if you have MyChart) OR A paper copy in the mail If you have any lab test that is abnormal or we need to change your treatment, we will call you to review the results.   Testing/Procedures:  None Ordered   Follow-Up: At [facility], you and your health needs are our priority.  As part of our continuing mission to provide you with exceptional heart care, we have created designated Provider Care Teams.  These Care Teams include your primary Cardiologist (physician) and Advanced Practice Providers (APPs -  Physician Assistants and Nurse Practitioners) who all work together to provide you with the care you need, when you need it.  We recommend signing up for the patient portal called "MyChart".  Sign up information is provided on this After Visit Summary.  MyChart is used to connect with patients for Virtual Visits (Telemedicine).  Patients are able to view lab/test results, encounter notes, upcoming appointments, etc.  Non-urgent messages can be sent to your provider as well.   To learn more about what you can do with MyChart, go to [email or web address].    Your next appointment:    As needed      Signed, Debbe Odea, MD  07/04/2023 11:09 AM    [facility]

## 2023-07-04 NOTE — Patient Instructions (Signed)
Medication Instructions:   Your physician recommends that you continue on your current medications as directed. Please refer to the Current Medication list given to you today.  *If you need a refill on your cardiac medications before your next appointment, please call your pharmacy*   Lab Work:  None Ordered  If you have labs (blood work) drawn today and your tests are completely normal, you will receive your results only by: MyChart Message (if you have MyChart) OR A paper copy in the mail If you have any lab test that is abnormal or we need to change your treatment, we will call you to review the results.   Testing/Procedures:  None Ordered   Follow-Up: At [facility], you and your health needs are our priority.  As part of our continuing mission to provide you with exceptional heart care, we have created designated Provider Care Teams.  These Care Teams include your primary Cardiologist (physician) and Advanced Practice Providers (APPs -  Physician Assistants and Nurse Practitioners) who all work together to provide you with the care you need, when you need it.  We recommend signing up for the patient portal called "MyChart".  Sign up information is provided on this After Visit Summary.  MyChart is used to connect with patients for Virtual Visits (Telemedicine).  Patients are able to view lab/test results, encounter notes, upcoming appointments, etc.  Non-urgent messages can be sent to your provider as well.   To learn more about what you can do with MyChart, go to [email or web address].    Your next appointment:    As needed

## 2023-07-12 ENCOUNTER — Ambulatory Visit
Admission: RE | Admit: 2023-07-12 | Discharge: 2023-07-12 | Disposition: A | Discharge status: Home / Self Care | Payer: Commercial Managed Care - PPO | Source: Ambulatory Visit | Attending: Family Medicine | Admitting: Family Medicine

## 2023-07-12 DIAGNOSIS — R590 Localized enlarged lymph nodes: Secondary | ICD-10-CM | POA: Diagnosis not present

## 2023-07-12 DIAGNOSIS — R599 Enlarged lymph nodes, unspecified: Secondary | ICD-10-CM | POA: Diagnosis not present

## 2023-07-12 MED ORDER — IOHEXOL 300 MG/ML  SOLN
100.0000 mL | Freq: Once | INTRAMUSCULAR | Status: AC | PRN
  Administered 2023-07-12: 100 mL via INTRAVENOUS

## 2023-07-15 ENCOUNTER — Encounter: Payer: Self-pay | Admitting: Family Medicine

## 2023-07-16 ENCOUNTER — Other Ambulatory Visit (HOSPITAL_COMMUNITY): Payer: Self-pay

## 2023-08-13 ENCOUNTER — Encounter: Payer: Self-pay | Admitting: Family Medicine

## 2023-08-13 ENCOUNTER — Other Ambulatory Visit: Payer: Self-pay

## 2023-08-13 DIAGNOSIS — K219 Gastro-esophageal reflux disease without esophagitis: Secondary | ICD-10-CM

## 2023-08-13 MED ORDER — DEXLANSOPRAZOLE 30 MG PO CPDR: 30.0000 mg | DELAYED_RELEASE_CAPSULE | Freq: Every day | ORAL | 3 refills | Status: AC

## 2023-08-20 DIAGNOSIS — N35911 Unspecified urethral stricture, male, meatal: Secondary | ICD-10-CM | POA: Diagnosis not present

## 2023-09-05 ENCOUNTER — Encounter: Payer: Self-pay | Admitting: Family Medicine

## 2023-09-06 ENCOUNTER — Other Ambulatory Visit: Payer: Self-pay

## 2023-09-06 MED ORDER — AZELASTINE HCL 0.1 % NA SOLN: 1.0000 | Freq: Two times a day (BID) | NASAL | 3 refills | Status: AC

## 2024-02-18 ENCOUNTER — Encounter: Payer: Self-pay | Admitting: Family Medicine

## 2024-02-18 ENCOUNTER — Ambulatory Visit: Payer: Self-pay | Admitting: Family Medicine

## 2024-02-18 ENCOUNTER — Ambulatory Visit (INDEPENDENT_AMBULATORY_CARE_PROVIDER_SITE_OTHER): Admitting: Family Medicine

## 2024-02-18 VITALS — BP 112/68 | HR 63 | Temp 97.6°F | Ht 75.0 in | Wt 184.0 lb

## 2024-02-18 DIAGNOSIS — Z130 Encounter for screening for diseases of the blood and blood-forming organs and certain disorders involving the immune mechanism: Secondary | ICD-10-CM | POA: Diagnosis not present

## 2024-02-18 DIAGNOSIS — Z1321 Encounter for screening for nutritional disorder: Secondary | ICD-10-CM | POA: Diagnosis not present

## 2024-02-18 DIAGNOSIS — E538 Deficiency of other specified B group vitamins: Secondary | ICD-10-CM | POA: Diagnosis not present

## 2024-02-18 DIAGNOSIS — K219 Gastro-esophageal reflux disease without esophagitis: Secondary | ICD-10-CM | POA: Diagnosis not present

## 2024-02-18 DIAGNOSIS — Z13220 Encounter for screening for lipoid disorders: Secondary | ICD-10-CM

## 2024-02-18 DIAGNOSIS — Z0000 Encounter for general adult medical examination without abnormal findings: Secondary | ICD-10-CM | POA: Diagnosis not present

## 2024-02-18 LAB — COMPREHENSIVE METABOLIC PANEL WITH GFR
ALT: 17 U/L (ref 0–53)
AST: 17 U/L (ref 0–37)
Albumin: 5 g/dL (ref 3.5–5.2)
Alkaline Phosphatase: 49 U/L (ref 39–117)
BUN: 15 mg/dL (ref 6–23)
CO2: 29 meq/L (ref 19–32)
Calcium: 10 mg/dL (ref 8.4–10.5)
Chloride: 106 meq/L (ref 96–112)
Creatinine, Ser: 0.97 mg/dL (ref 0.40–1.50)
GFR: 98.07 mL/min (ref >60.00)
Glucose, Bld: 78 mg/dL (ref 70–99)
Potassium: 4.6 meq/L (ref 3.5–5.1)
Sodium: 137 meq/L (ref 135–145)
Total Bilirubin: 0.7 mg/dL (ref 0.2–1.2)
Total Protein: 7.6 g/dL (ref 6.0–8.3)

## 2024-02-18 LAB — CBC WITH DIFFERENTIAL/PLATELET
Basophils Absolute: 0 K/uL (ref 0.0–0.1)
Basophils Relative: 0.6 % (ref 0.0–3.0)
Eosinophils Absolute: 0.1 K/uL (ref 0.0–0.7)
Eosinophils Relative: 1.1 % (ref 0.0–5.0)
HCT: 46.6 % (ref 39.0–52.0)
Hemoglobin: 15.5 g/dL (ref 13.0–17.0)
Lymphocytes Relative: 26.3 % (ref 12.0–46.0)
Lymphs Abs: 1.7 K/uL (ref 0.7–4.0)
MCHC: 33.3 g/dL (ref 30.0–36.0)
MCV: 92.8 fl (ref 78.0–100.0)
Monocytes Absolute: 0.5 K/uL (ref 0.1–1.0)
Monocytes Relative: 7.2 % (ref 3.0–12.0)
Neutro Abs: 4.1 K/uL (ref 1.4–7.7)
Neutrophils Relative %: 64.8 % (ref 43.0–77.0)
Platelets: 277 K/uL (ref 150.0–400.0)
RBC: 5.02 Mil/uL (ref 4.22–5.81)
RDW: 12.8 % (ref 11.5–15.5)
WBC: 6.4 K/uL (ref 4.0–10.5)

## 2024-02-18 LAB — LIPID PANEL
Cholesterol: 151 mg/dL (ref 0–200)
HDL: 49.3 mg/dL (ref >39.00)
LDL Cholesterol: 88 mg/dL (ref 0–99)
NonHDL: 101.85
Total CHOL/HDL Ratio: 3
Triglycerides: 67 mg/dL (ref 0.0–149.0)
VLDL: 13.4 mg/dL (ref 0.0–40.0)

## 2024-02-18 LAB — VITAMIN B12: Vitamin B-12: 1035 pg/mL — ABNORMAL HIGH (ref 211–911)

## 2024-02-18 LAB — VITAMIN D 25 HYDROXY (VIT D DEFICIENCY, FRACTURES): VITD: 49.1 ng/mL (ref 30.00–100.00)

## 2024-02-18 NOTE — Progress Notes (Signed)
 Phone: [phone]    Subjective:  Patient presents today for their annual physical. Chief complaint-noted.   See problem oriented charting- ROS- full  review of systems was completed and negative  except for topics noted under acute/chronic concerns  The following were reviewed and entered/updated in epic: Past Medical History:  Diagnosis Date   Allergy    claritin prn   Anxiety    sometimes gets concerned about medical issues. stress with work.    Chronic tension headaches    improved with change in pillow- monitoring to see if will still occur   Depression    [employer]   GERD (gastroesophageal reflux disease)    prilosec 20mg  since weight gain- wants to be closer to 190 then trial off.    Lichen sclerosus 2020   Patient Active Problem List   Diagnosis Date Noted   Anxiety     Priority: Medium    GERD (gastroesophageal reflux disease)     Priority: Medium    Chronic tension headaches     Priority: Medium    Acute left-sided low back pain with left-sided sciatica 10/04/2021    Priority: Low   Cubital tunnel syndrome, left 04/13/2021    Priority: Low   Allergic rhinitis     Priority: Low   Complex tear of medial meniscus of left knee as current injury 11/13/2022   Past Surgical History:  Procedure Laterality Date   24 HOUR PH STUDY N/A 06/01/2020   Procedure: 24 HOUR PH STUDY;  Surgeon: San Sandor GAILS, DO;  Location: [facility];  Service: Gastroenterology;  Laterality: N/A;   CIRCUMCISION  08/2018   ESOPHAGEAL MANOMETRY N/A 06/01/2020   Procedure: ESOPHAGEAL MANOMETRY (EM);  Surgeon: San Sandor GAILS, DO;  Location: [facility];  Service: Gastroenterology;  Laterality: N/A;   ESOPHAGOGASTRODUODENOSCOPY (EGD) WITH PROPOFOL  N/A 10/09/2022   Procedure: ESOPHAGOGASTRODUODENOSCOPY (EGD) WITH PROPOFOL ;  Surgeon: Maryruth Ole DASEN, MD;  Location: [facility];  Service: Endoscopy;  Laterality: N/A;   KNEE ARTHROSCOPY WITH MEDIAL MENISECTOMY Left 11/13/2022    Procedure: LEFT KNEE ARTHROSCOPY WITH PARTIAL MEDIAL MENISCECTOMY;  Surgeon: Genelle Standing, MD;  Location: [facility];  Service: Orthopedics;  Laterality: Left;   UPPER GASTROINTESTINAL ENDOSCOPY  01/07/2020   VASECTOMY     WISDOM TOOTH EXTRACTION Bilateral 2002    Family History  Problem Relation Age of Onset   Healthy Mother    Cancer Father        GIST   Lung cancer Maternal Grandmother        smoker   Diabetes Maternal Grandfather    Stroke Maternal Grandfather    Skin cancer Paternal Grandmother    Arthritis/Rheumatoid Paternal Grandmother    Skin cancer Paternal Grandfather    Heart disease Paternal Grandfather        CABG in 57s   Colon cancer Paternal Grandfather    Testicular cancer Paternal Uncle    Hypertension Paternal Uncle    Hyperlipidemia Paternal Uncle    Esophageal cancer Neg Hx    Rectal cancer Neg Hx    Stomach cancer Neg Hx     Medications- reviewed and updated Current Outpatient Medications  Medication Sig Dispense Refill   augmented betamethasone  dipropionate (DIPROLENE -AF) 0.05 % ointment Apply topically once as needed. 30 g 1   azelastine  (ASTELIN ) 0.1 % nasal spray Place 1 spray into both nostrils 2 (two) times daily. Use in each nostril as directed 90 mL 3   B Complex Vitamins (B-COMPLEX/B-12 PO) Take 1,000 mcg by  mouth daily.     cholecalciferol (VITAMIN D3) 25 MCG (1000 UNIT) tablet Take 2,000 Units by mouth daily.     Dexlansoprazole  30 MG capsule DR Take 1 capsule (30 mg total) by mouth daily. 90 capsule 3   fexofenadine (ALLEGRA) 180 MG tablet Take 180 mg by mouth daily.     fluticasone  (FLONASE ) 50 MCG/ACT nasal spray Place 1 spray into both nostrils daily as needed for allergies or rhinitis. 16 g 3   No current facility-administered medications for this visit.    Allergies-reviewed and updated No Known Allergies  Social History   Social History Narrative   Married. Daugher born nov 2020 and another daughter December  2022   -moved to [place] 2024      Now at [employer] in 2025- still part time set up 3.5 days!  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 Oropharynx normal Neck: no thyromegaly CV: RRR no murmurs rubs or gallops Lungs: CTAB no crackles, wheeze, rhonchi Abdomen: soft/nontender/nondistended/normal bowel sounds. No rebound or guarding.  Ext: no edema Skin: warm, dry Neuro: grossly normal, moves all extremities, PERRLA Declines genitourinary or rectal concerns    Assessment and Plan:  38 y.o. male presenting for annual physical.  Health Maintenance counseling: 1. Anticipatory guidance: Patient counseled regarding regular dental exams -q6 months, eye exams -seen 2 weeks ago ,  avoiding smoking and second hand smoke , limiting alcohol to 2 beverages per day-1 a month, no illicit drugs.   2. Risk factor reduction:  Advised patient of need for regular exercise and diet rich and fruits and vegetables to reduce risk of heart attack and stroke.  Exercise-last year limited by back and knee and felt like he was losing some muscle mass.  Reports has healed well after prior left knee surgery June 2024. Had to pull back on bike. Strength tranining twice a week and either walks/plays golf/ruck  Diet/weight management-Down 10 pounds from last year physical- has lost 2 inches off midsection with exercise and cleaner smaller volume eating- helps rflux.  Wt Readings from Last 3 Encounters:  02/18/24 184 lb (83.5 kg)  07/04/23 199 lb 3.2 oz (90.4 kg)  04/24/23 200 lb 9.6 oz (91 kg)   3. Immunizations/screenings/ancillary studies-plans on fall flu shot  otherwise up-to-date  Immunization History  Administered Date(s) Administered   Influenza Inj Mdck Quad Pf 03/27/2022   Influenza,inj,Quad PF,6+ Mos 03/18/2021   Influenza-Unspecified 03/15/2016, 03/14/2018, 03/20/2019   PFIZER(Purple Top)SARS-COV-2 Vaccination 06/22/2019, 07/10/2019   Tdap 01/31/2017  4. Prostate cancer screening- prior PSA with Drs. Day labs.  Will plan on screening at 21 with no family history.  Lab Results  Component Value Date   PSA 0.3 09/06/2017   5. Colon cancer screening - grandfather at age 18 with colon cancer but otherwise no early family history-plan/screening at 50  6. Skin cancer screening/prevention-has seen in the past but not recently. advised regular sunscreen use. Denies worrisome, changing, or new skin lesions.  7. Testicular cancer screening- advised monthly self exams  8. STD  screening- patient opts out-monogamous.  Vasectomy after second child. Never confirmed efficacy- plans to do so- wife still contraception 9. 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 Ended up thinking may have pelvic floor weakness- referred to physical therapy but finally improved. Had been riding bike on trainer and was constipation and thinks may have irritated with that combo- pulled back on bike. Takes fiber now- doing better  # Allergies-remains on Astelin , Flonase , Allegra regular use.   # Vitamin D  supplementation -takes 2000 units a day- no hitsory of lows . Wants to assess levels  # Lichen sclerosus-has worked with urology on this-has betamethasone  twice a week to keep at bay- maintenance long term   Recommended follow up: Return in about 1 year (around  02/17/2025) for physical or sooner if needed.Schedule b4 you leave.  Lab/Order associations: fasting   ICD-10-CM   1. Preventative health care  Z00.00     2. Gastroesophageal reflux disease without esophagitis  K21.9     3. Screening for hyperlipidemia  Z13.220 Comprehensive metabolic panel with GFR    Lipid panel    4. Screening, anemia, deficiency, iron  Z13.0 CBC with Differential/Platelet    5. B12 deficiency  E53.8 Vitamin B12    6. Encounter for vitamin deficiency screening  Z13.21 VITAMIN D  25 Hydroxy (Vit-D Deficiency, Fractures)     Return precautions advised.  Garnette Lukes, MD

## 2024-02-18 NOTE — Patient Instructions (Addendum)
 Please stop by lab before you go If you have mychart- we will send your results within 3 business days of us  receiving them.  If you do not have mychart- we will call you about results within 5 business days of us  receiving them.  *please also note that you will see labs on mychart as soon as they post. I will later go in and write notes on them- will say notes from Dr. Katrinka   Recommended follow up: Return in about 1 year (around 02/17/2025) for physical or sooner if needed.Schedule b4 you leave.

## 2024-03-18 ENCOUNTER — Encounter: Admitting: Family Medicine

## 2024-06-10 ENCOUNTER — Telehealth: Admitting: Physician Assistant

## 2024-06-10 DIAGNOSIS — J019 Acute sinusitis, unspecified: Secondary | ICD-10-CM

## 2024-06-10 DIAGNOSIS — B9689 Other specified bacterial agents as the cause of diseases classified elsewhere: Secondary | ICD-10-CM | POA: Diagnosis not present

## 2024-06-10 MED ORDER — AMOXICILLIN-POT CLAVULANATE 875-125 MG PO TABS: 1.0000 | ORAL_TABLET | Freq: Two times a day (BID) | ORAL | 0 refills | Status: AC

## 2024-06-10 NOTE — Progress Notes (Signed)
